# Patient Record
Sex: Male | Born: 1935 | Race: White | Hispanic: No | Marital: Married | Smoking: Former smoker
Health system: Southern US, Community
[De-identification: ages and names within clinical notes are randomized; demographics above are authoritative.]

## PROBLEM LIST (undated history)

## (undated) DIAGNOSIS — N200 Calculus of kidney: Secondary | ICD-10-CM

## (undated) DIAGNOSIS — I209 Angina pectoris, unspecified: Secondary | ICD-10-CM

## (undated) DIAGNOSIS — I10 Essential (primary) hypertension: Secondary | ICD-10-CM

## (undated) DIAGNOSIS — R9439 Abnormal result of other cardiovascular function study: Secondary | ICD-10-CM

## (undated) DIAGNOSIS — M519 Unspecified thoracic, thoracolumbar and lumbosacral intervertebral disc disorder: Secondary | ICD-10-CM

## (undated) DIAGNOSIS — E785 Hyperlipidemia, unspecified: Secondary | ICD-10-CM

## (undated) HISTORY — PX: CATARACT EXTRACTION: SUR2

## (undated) HISTORY — DX: Abnormal result of other cardiovascular function study: R94.39

## (undated) HISTORY — PX: TONSILLECTOMY: SUR1361

## (undated) HISTORY — PX: CARPAL TUNNEL RELEASE: SHX101

## (undated) HISTORY — PX: LUMBAR LAMINECTOMY: SHX95

## (undated) HISTORY — DX: Angina pectoris, unspecified: I20.9

## (undated) HISTORY — PX: KIDNEY STONE SURGERY: SHX686

---

## 2001-03-02 ENCOUNTER — Encounter: Admission: RE | Admit: 2001-03-02 | Discharge: 2001-03-02 | Payer: Self-pay | Admitting: Internal Medicine

## 2001-03-02 ENCOUNTER — Encounter: Payer: Self-pay | Admitting: Internal Medicine

## 2001-06-28 ENCOUNTER — Encounter: Payer: Self-pay | Admitting: Internal Medicine

## 2001-06-28 ENCOUNTER — Encounter: Admission: RE | Admit: 2001-06-28 | Discharge: 2001-06-28 | Payer: Self-pay | Admitting: Internal Medicine

## 2002-06-24 ENCOUNTER — Ambulatory Visit (HOSPITAL_COMMUNITY): Admission: RE | Admit: 2002-06-24 | Discharge: 2002-06-24 | Payer: Self-pay | Admitting: *Deleted

## 2006-03-03 ENCOUNTER — Emergency Department (HOSPITAL_COMMUNITY): Admission: EM | Admit: 2006-03-03 | Discharge: 2006-03-03 | Payer: Self-pay | Admitting: Emergency Medicine

## 2006-03-04 ENCOUNTER — Ambulatory Visit (HOSPITAL_COMMUNITY): Admission: RE | Admit: 2006-03-04 | Discharge: 2006-03-04 | Payer: Self-pay | Admitting: Orthopedic Surgery

## 2011-09-26 ENCOUNTER — Ambulatory Visit
Admission: RE | Admit: 2011-09-26 | Discharge: 2011-09-26 | Disposition: A | Discharge status: Home / Self Care | Payer: Medicare Other | Source: Ambulatory Visit | Attending: Orthopedic Surgery | Admitting: Orthopedic Surgery

## 2011-09-26 ENCOUNTER — Other Ambulatory Visit: Payer: Self-pay | Admitting: Orthopedic Surgery

## 2011-09-26 DIAGNOSIS — M542 Cervicalgia: Secondary | ICD-10-CM

## 2014-03-11 ENCOUNTER — Other Ambulatory Visit: Payer: Self-pay | Admitting: Cardiology

## 2014-03-11 ENCOUNTER — Ambulatory Visit
Admission: RE | Admit: 2014-03-11 | Discharge: 2014-03-11 | Disposition: A | Discharge status: Home / Self Care | Payer: Medicare Other | Source: Ambulatory Visit | Attending: Cardiology | Admitting: Cardiology

## 2014-03-11 DIAGNOSIS — R0789 Other chest pain: Secondary | ICD-10-CM

## 2014-03-11 NOTE — H&P (Signed)
Benjamin Jensen  Date of visit:  03/11/2014 DOB:  Dec 08, 1935    Age:  78 yrs. Medical record number:  [id number]     Account number:  [id number] Primary Care Provider: Lorene Dy ____________________________ CURRENT DIAGNOSES  1. Chest Pain  2. Hyperlipidemia  3. Hypertension,Essential (Benign) ____________________________ ALLERGIES  Pravastatin, Muscle aches ____________________________ MEDICATIONS  1. amlodipine 5 mg tablet, 1 p.o. daily  2. aspirin 81 mg chewable tablet, 1 p.o. daily  3. red yeast rice 600 mg tablet, 1 p.o. daily  4. saw palmetto 500 mg capsule, 1 p.o. daily  5. Ambien 5 mg tablet, PRN  6. glucosamine HCl 1,500 mg tablet, BID  7. Urinozinc Prostate Formula 100 mg tablet, 1 p.o. daily  8. Fish Oil 360 mg-1,200 mg capsule, 2 p.o. daily  9. triamterene 75 mg-hydrochlorothiazide 50 mg tablet, 1/2 tab daily  10. metoprolol tartrate 25 mg tablet, BID ____________________________ CHIEF COMPLAINTS  Chest pressure  Dyspnea ____________________________ HISTORY OF PRESENT ILLNESS This very nice 78 year old male is seen at the request of Dr. Mancel Bale for evaluation of chest discomfort. The patient has a previous diagnosis of hypertension and hyperlipidemia. He developed severe muscle weakness when he tried to take statin therapy and has not been using it. For several months he has had some mild dyspnea on exertion but he has also had intermittent chest discomfort. He describes the feeling as a lower chest pressure type feeling that will last for up to an hour. It is not related to activity or exertion and as a matter fact he had an episode earlier today. It does not radiate to the neck or the arms and is not associated with sweating. The pain is not pleuritic and not made worse with food. He was seen at his primary 67 office recently and I was asked to see him for evaluation. He does not have PND, orthopnea, syncope, claudication or palpitations. He does have a past  history of lumbar disc disease as well as carpal tunnel syndrome.  He had a treadmill test done in the office today that had clinical angina as well as ST depression with T wave inversions in recovery however he had good exercise capacity walking 10 minutes into Bruce stage IV. ____________________________ PAST HISTORY  Past Medical Illnesses:  hypertension, hyperlipidemia, lumbar disc disease, kidney stones, carpal tunnel syndrome;  Cardiovascular Illnesses:  no previous history of cardiac disease.;  Surgical Procedures:  cataract extraction OU, carpal tunnel release, kidney stone removal, laminectomy lumbar, tonsillectomy;  Cardiology Procedures-Invasive:  no history of prior cardiac procedures;  Cardiology Procedures-Noninvasive:  treadmill;  LVEF not documented,   ____________________________ CARDIO-PULMONARY TEST DATES EKG Date:  03/11/2014;   ____________________________ FAMILY HISTORY Father -- Father dead, Heart Attack, Diabetes mellitus Mother -- Mother dead, Congestive heart failure, Coronary artery bypass grafting Sister -- Sister alive with problem, TIA ____________________________ SOCIAL HISTORY Alcohol Use:  no alcohol use;  Smoking:  used to smoke but quit Prior to 1980;  Diet:  regular diet;  Lifestyle:  divorced and remarried;  Exercise:  exercises regularly;  Occupation:  retired Public librarian;  Residence:  lives with wife;   ____________________________ REVIEW OF SYSTEMS General:  denies recent weight change, fatique or change in exercise tolerance.  Integumentary:no rashes or new skin lesions. Eyes: wears eye glasses/contact lenses, cataract extraction bilaterally Ears, Nose, Throat, Mouth:  denies any hearing loss, epistaxis, hoarseness or difficulty speaking. Respiratory: mild dyspnea with exertion Cardiovascular:  please review HPI Abdominal: denies dyspepsia, GI bleeding, constipation, or  diarrhea Genitourinary-Male: hesitancy  Musculoskeletal:  mild chronic back pain  Neurological:  denies headaches, stroke, or TIA Psychiatric:  denies depession or anxiety  ____________________________ PHYSICAL EXAMINATION VITAL SIGNS  Blood Pressure:  144/80 Sitting, Right arm, regular cuff  , 150/80 Standing, Right arm and regular cuff   Pulse:  64/min. Weight:  181.00 lbs. Height:  69"BMI: 27  Constitutional:  pleasant white male in no acute distress Skin:  warm and dry to touch, no apparent skin lesions, or masses noted. Head:  normocephalic, normal hair pattern, no masses or tenderness Eyes:  EOMS Intact, PERRLA, C and S clear, Funduscopic exam not done. ENT:  ears, nose and throat reveal no gross abnormalities.  Dentition good. Neck:  supple, without massess. No JVD, thyromegaly or carotid bruits. Carotid upstroke normal. Chest:  normal symmetry, clear to auscultation. Cardiac:  regular rhythm, normal S1 and S2, No S3 or S4, no murmurs, gallops or rubs detected. Abdomen:  abdomen soft,non-tender, no masses, no hepatospenomegaly, or aneurysm noted Peripheral Pulses:  the femoral,dorsalis pedis, and posterior tibial pulses are full and equal bilaterally with no bruits auscultated. Extremities & Back:  no deformities, clubbing, cyanosis, erythema or edema observed. Normal muscle strength and tone. Neurological:  no gross motor or sensory deficits noted, affect appropriate, oriented x3. ____________________________ MOST RECENT LIPID PANEL 03/06/14  CHOL TOTL 223 mg/dl, LDL 157 NM, HDL 40 mg/dl, TRIGLYCER 131 mg/dl, ALT 18 u/l, ALK PHOS 37 u/l, CHOL/HDL 5.6 (Calc) and AST 17 u/l ____________________________ IMPRESSIONS/PLAN  1. Chest discomfort with some atypical features but with an abnormal exercise treadmill and clinical angina on the treadmill 2. Hypertension 3. Hyperlipidemia with history of statin intolerance 4. Lumbar disc disease 5. History of carpal tunnel syndrome  Recommendations:  In light of abnormal treadmill and chest pain suggestive of angina  plan cardiac catheterization. He was started on metoprolol 25 mg twice daily. Cardiac catheterization was discussed with the patient including risks of myocardial infarction, death, stroke, bleeding, arrhythmia, dye allergy, or renal insufficiency. He understands and is willing to proceed. Possibility of percutaneous intervention at the same setting was also discussed with the patient including risks. ____________________________ TODAYS ORDERS  1. CHEST XRAY: Today  2. Chest X-ray PA/Lat: today  3. 12 Lead EKG: Today                       ____________________________ Cardiology Physician:  Kerry Hough MD [facility]

## 2014-03-12 ENCOUNTER — Encounter (HOSPITAL_COMMUNITY): Payer: Self-pay

## 2014-03-17 ENCOUNTER — Encounter (HOSPITAL_COMMUNITY)
Admission: RE | Disposition: A | Discharge status: Home / Self Care | Payer: Self-pay | Source: Ambulatory Visit | Attending: Cardiology

## 2014-03-17 ENCOUNTER — Encounter (HOSPITAL_COMMUNITY): Payer: Self-pay | Admitting: Cardiology

## 2014-03-17 ENCOUNTER — Ambulatory Visit (HOSPITAL_COMMUNITY)
Admission: RE | Admit: 2014-03-17 | Discharge: 2014-03-17 | Disposition: A | Discharge status: Home / Self Care | Payer: Medicare Other | Source: Ambulatory Visit | Attending: Cardiology | Admitting: Cardiology

## 2014-03-17 DIAGNOSIS — I10 Essential (primary) hypertension: Secondary | ICD-10-CM

## 2014-03-17 DIAGNOSIS — E785 Hyperlipidemia, unspecified: Secondary | ICD-10-CM | POA: Diagnosis not present

## 2014-03-17 DIAGNOSIS — Z79899 Other long term (current) drug therapy: Secondary | ICD-10-CM | POA: Insufficient documentation

## 2014-03-17 DIAGNOSIS — R079 Chest pain, unspecified: Secondary | ICD-10-CM | POA: Insufficient documentation

## 2014-03-17 DIAGNOSIS — R9439 Abnormal result of other cardiovascular function study: Secondary | ICD-10-CM

## 2014-03-17 DIAGNOSIS — Z7982 Long term (current) use of aspirin: Secondary | ICD-10-CM | POA: Diagnosis not present

## 2014-03-17 DIAGNOSIS — R0609 Other forms of dyspnea: Secondary | ICD-10-CM | POA: Diagnosis not present

## 2014-03-17 DIAGNOSIS — R0989 Other specified symptoms and signs involving the circulatory and respiratory systems: Secondary | ICD-10-CM | POA: Insufficient documentation

## 2014-03-17 DIAGNOSIS — I209 Angina pectoris, unspecified: Secondary | ICD-10-CM

## 2014-03-17 HISTORY — DX: Hyperlipidemia, unspecified: E78.5

## 2014-03-17 HISTORY — DX: Unspecified thoracic, thoracolumbar and lumbosacral intervertebral disc disorder: M51.9

## 2014-03-17 HISTORY — DX: Essential (primary) hypertension: I10

## 2014-03-17 HISTORY — PX: LEFT HEART CATHETERIZATION WITH CORONARY ANGIOGRAM: SHX5451

## 2014-03-17 HISTORY — DX: Abnormal result of other cardiovascular function study: R94.39

## 2014-03-17 HISTORY — DX: Calculus of kidney: N20.0

## 2014-03-17 HISTORY — DX: Angina pectoris, unspecified: I20.9

## 2014-03-17 LAB — PROTIME-INR
INR: 0.99 (ref 0.00–1.49)
Prothrombin Time: 13.1 seconds (ref 11.6–15.2)

## 2014-03-17 SURGERY — LEFT HEART CATHETERIZATION WITH CORONARY ANGIOGRAM
Anesthesia: LOCAL

## 2014-03-17 MED ORDER — SODIUM CHLORIDE 0.9 % IJ SOLN
3.0000 mL | INTRAMUSCULAR | Status: DC | PRN
  Administered 2014-03-17: 3 mL via INTRAVENOUS

## 2014-03-17 MED ORDER — SODIUM CHLORIDE 0.9 % IJ SOLN: 3.0000 mL | Freq: Two times a day (BID) | INTRAMUSCULAR | Status: DC

## 2014-03-17 MED ORDER — MIDAZOLAM HCL 2 MG/2ML IJ SOLN
INTRAMUSCULAR | Status: AC
Start: 2014-03-17 — End: 2014-03-17
  Filled 2014-03-17: qty 2

## 2014-03-17 MED ORDER — SODIUM CHLORIDE 0.9 % IV SOLN: 250.0000 mL | INTRAVENOUS | Status: DC | PRN

## 2014-03-17 MED ORDER — ASPIRIN 81 MG PO CHEW
CHEWABLE_TABLET | ORAL | Status: AC
  Filled 2014-03-17: qty 1

## 2014-03-17 MED ORDER — NITROGLYCERIN 0.2 MG/ML ON CALL CATH LAB
INTRAVENOUS | Status: AC
  Filled 2014-03-17: qty 1

## 2014-03-17 MED ORDER — ASPIRIN 81 MG PO CHEW
81.0000 mg | CHEWABLE_TABLET | ORAL | Status: AC
  Administered 2014-03-17: 81 mg via ORAL

## 2014-03-17 MED ORDER — SODIUM CHLORIDE 0.9 % IV SOLN
INTRAVENOUS | Status: DC
  Administered 2014-03-17: 06:00:00 via INTRAVENOUS

## 2014-03-17 MED ORDER — SODIUM CHLORIDE 0.9 % IV SOLN: 1.0000 mL/kg/h | INTRAVENOUS | Status: DC

## 2014-03-17 MED ORDER — LIDOCAINE HCL (PF) 1 % IJ SOLN
INTRAMUSCULAR | Status: AC
  Filled 2014-03-17: qty 30

## 2014-03-17 MED ORDER — HEPARIN (PORCINE) IN NACL 2-0.9 UNIT/ML-% IJ SOLN
INTRAMUSCULAR | Status: AC
Start: 2014-03-17 — End: 2014-03-17
  Filled 2014-03-17: qty 1000

## 2014-03-17 NOTE — Discharge Instructions (Signed)
Heart Catheterization [facility] ° ° ° °A catheter was placed through the blood vessel in your groin, contrast was injected into the vessels, and pictures were taken. ° ° You may feel some discomfort at the insertion site after the local anesthetic wears off. This discomfort should gradually improve over the next several days. °· Only take over-the-counter or prescription medicines for pain, discomfort, or fever as directed by your caregiver.  °· Complications are very uncommon after this procedure.Call my office if you develop any of the following symptoms:  °· Worsening pain.  °· Bleeding.  °· Severe swelling at the puncture site.  °· Lightheadedness.  °· Dizziness or fainting.  °· Fever or chills.  °· If oozing, bleeding, or a lump appears at the puncture site, apply firm pressure directly to the site steadily for 15 minutes and go to the emergency department.  °· Keep the skin around the insertion site dry. You may take showers after 24 hours. If the area does get wet, dry the skin completely. Avoid baths until the skin puncture site heals, usually 5 to 7 days.  °· Rest  for the remainder of the day and avoid any heavy lifting (more than 10 pounds or 4.5 kg). Do not operate heavy machinery, drive, or make legal decisions for the first 24 hours after the procedure. Have a responsible person drive you home.  °· You may resume your usual diet after the procedure. Avoid alcoholic beverages for 24 hours after the procedure.  ° °

## 2014-03-17 NOTE — Progress Notes (Signed)
Received report from Vernona RiegerLaura, RN from cath lab

## 2014-03-17 NOTE — Progress Notes (Addendum)
Order for sheath removal verified per post procedural orders. Procedure explained to patient and Rt femoral artery access site assessed: level 0, right palpable dorsalis pedis and posterior tibial pulses. 5 [place] Sheath removed and manual pressure applied for 20  minutes. Pre, peri, & post procedural vitals: HR 51, RR 12 , O2 Sat 98% RA, BP 130/76, Pain 0. Distal pulses remained intact after sheath removal. Access site level 0 and dressed with 4X4 gauze and tegaderm.  , StatisticianCrystal RN confirmed condition of site. Post procedural instructions discussed with return demonstration from patient.

## 2014-03-17 NOTE — Interval H&P Note (Signed)
History and Physical Interval Note:  03/17/2014 6:46 AM   Patient seen and examined.  No interval change in history and exam since last note.  Stable for procedure.  Benjamin Jensen Benjamin Jensen, Jr. MD [facility]  03/17/2014

## 2014-03-17 NOTE — H&P (View-Only) (Signed)
Benjamin Jensen  Date of visit:  03/11/2014 DOB:  Dec 08, 1935    Age:  78 yrs. Medical record number:  [id number]     Account number:  [id number] Primary Care Provider: Lorene Dy ____________________________ CURRENT DIAGNOSES  1. Chest Pain  2. Hyperlipidemia  3. Hypertension,Essential (Benign) ____________________________ ALLERGIES  Pravastatin, Muscle aches ____________________________ MEDICATIONS  1. amlodipine 5 mg tablet, 1 p.o. daily  2. aspirin 81 mg chewable tablet, 1 p.o. daily  3. red yeast rice 600 mg tablet, 1 p.o. daily  4. saw palmetto 500 mg capsule, 1 p.o. daily  5. Ambien 5 mg tablet, PRN  6. glucosamine HCl 1,500 mg tablet, BID  7. Urinozinc Prostate Formula 100 mg tablet, 1 p.o. daily  8. Fish Oil 360 mg-1,200 mg capsule, 2 p.o. daily  9. triamterene 75 mg-hydrochlorothiazide 50 mg tablet, 1/2 tab daily  10. metoprolol tartrate 25 mg tablet, BID ____________________________ CHIEF COMPLAINTS  Chest pressure  Dyspnea ____________________________ HISTORY OF PRESENT ILLNESS This very nice 78 year old male is seen at the request of Dr. Mancel Bale for evaluation of chest discomfort. The patient has a previous diagnosis of hypertension and hyperlipidemia. He developed severe muscle weakness when he tried to take statin therapy and has not been using it. For several months he has had some mild dyspnea on exertion but he has also had intermittent chest discomfort. He describes the feeling as a lower chest pressure type feeling that will last for up to an hour. It is not related to activity or exertion and as a matter fact he had an episode earlier today. It does not radiate to the neck or the arms and is not associated with sweating. The pain is not pleuritic and not made worse with food. He was seen at his primary 67 office recently and I was asked to see him for evaluation. He does not have PND, orthopnea, syncope, claudication or palpitations. He does have a past  history of lumbar disc disease as well as carpal tunnel syndrome.  He had a treadmill test done in the office today that had clinical angina as well as ST depression with T wave inversions in recovery however he had good exercise capacity walking 10 minutes into Bruce stage IV. ____________________________ PAST HISTORY  Past Medical Illnesses:  hypertension, hyperlipidemia, lumbar disc disease, kidney stones, carpal tunnel syndrome;  Cardiovascular Illnesses:  no previous history of cardiac disease.;  Surgical Procedures:  cataract extraction OU, carpal tunnel release, kidney stone removal, laminectomy lumbar, tonsillectomy;  Cardiology Procedures-Invasive:  no history of prior cardiac procedures;  Cardiology Procedures-Noninvasive:  treadmill;  LVEF not documented,   ____________________________ CARDIO-PULMONARY TEST DATES EKG Date:  03/11/2014;   ____________________________ FAMILY HISTORY Father -- Father dead, Heart Attack, Diabetes mellitus Mother -- Mother dead, Congestive heart failure, Coronary artery bypass grafting Sister -- Sister alive with problem, TIA ____________________________ SOCIAL HISTORY Alcohol Use:  no alcohol use;  Smoking:  used to smoke but quit Prior to 1980;  Diet:  regular diet;  Lifestyle:  divorced and remarried;  Exercise:  exercises regularly;  Occupation:  retired Public librarian;  Residence:  lives with wife;   ____________________________ REVIEW OF SYSTEMS General:  denies recent weight change, fatique or change in exercise tolerance.  Integumentary:no rashes or new skin lesions. Eyes: wears eye glasses/contact lenses, cataract extraction bilaterally Ears, Nose, Throat, Mouth:  denies any hearing loss, epistaxis, hoarseness or difficulty speaking. Respiratory: mild dyspnea with exertion Cardiovascular:  please review HPI Abdominal: denies dyspepsia, GI bleeding, constipation, or  diarrhea Genitourinary-Male: hesitancy  Musculoskeletal:  mild chronic back pain  Neurological:  denies headaches, stroke, or TIA Psychiatric:  denies depession or anxiety  ____________________________ PHYSICAL EXAMINATION VITAL SIGNS  Blood Pressure:  144/80 Sitting, Right arm, regular cuff  , 150/80 Standing, Right arm and regular cuff   Pulse:  64/min. Weight:  181.00 lbs. Height:  69"BMI: 27  Constitutional:  pleasant white male in no acute distress Skin:  warm and dry to touch, no apparent skin lesions, or masses noted. Head:  normocephalic, normal hair pattern, no masses or tenderness Eyes:  EOMS Intact, PERRLA, C and S clear, Funduscopic exam not done. ENT:  ears, nose and throat reveal no gross abnormalities.  Dentition good. Neck:  supple, without massess. No JVD, thyromegaly or carotid bruits. Carotid upstroke normal. Chest:  normal symmetry, clear to auscultation. Cardiac:  regular rhythm, normal S1 and S2, No S3 or S4, no murmurs, gallops or rubs detected. Abdomen:  abdomen soft,non-tender, no masses, no hepatospenomegaly, or aneurysm noted Peripheral Pulses:  the femoral,dorsalis pedis, and posterior tibial pulses are full and equal bilaterally with no bruits auscultated. Extremities & Back:  no deformities, clubbing, cyanosis, erythema or edema observed. Normal muscle strength and tone. Neurological:  no gross motor or sensory deficits noted, affect appropriate, oriented x3. ____________________________ MOST RECENT LIPID PANEL 03/06/14  CHOL TOTL 223 mg/dl, LDL 157 NM, HDL 40 mg/dl, TRIGLYCER 131 mg/dl, ALT 18 u/l, ALK PHOS 37 u/l, CHOL/HDL 5.6 (Calc) and AST 17 u/l ____________________________ IMPRESSIONS/PLAN  1. Chest discomfort with some atypical features but with an abnormal exercise treadmill and clinical angina on the treadmill 2. Hypertension 3. Hyperlipidemia with history of statin intolerance 4. Lumbar disc disease 5. History of carpal tunnel syndrome  Recommendations:  In light of abnormal treadmill and chest pain suggestive of angina  plan cardiac catheterization. He was started on metoprolol 25 mg twice daily. Cardiac catheterization was discussed with the patient including risks of myocardial infarction, death, stroke, bleeding, arrhythmia, dye allergy, or renal insufficiency. He understands and is willing to proceed. Possibility of percutaneous intervention at the same setting was also discussed with the patient including risks. ____________________________ TODAYS ORDERS  1. CHEST XRAY: Today  2. Chest X-ray PA/Lat: today  3. 12 Lead EKG: Today                       ____________________________ Cardiology Physician:  Kerry Hough MD [facility]

## 2014-03-17 NOTE — CV Procedure (Signed)
Cardiac Catheterization Report   Benjamin Jensen    78 y.o.  male  DOB: October 24, 1935  MRN: [id number]  03/17/2014    PROCEDURE:  Left heart catheterization with selective coronary angiography, left ventriculogram.  INDICATIONS:  Abnormal treadmill the patient with multiple risk factors and angina pectoris with an abnormal exercise treadmill  The risks, benefits, and details of the procedure were explained to the patient.  The patient verbalized understanding and wanted to proceed.  Informed written consent was obtained.  PROCEDURE TECHNIQUE:  After Xylocaine anesthesia a 33F sheath was placed in the right femoral artery with a single anterior needle wall stick.   Left coronary angiography was done using a Judkins L4 guide catheter.  Right coronary angiography was done using a Judkins R4 guide catheter.  A 30 cc ventriculogram was performed in the 30 degree RAO projection.  Tolerated the procedure well.  Sheath removed in the holding area.   CONTRAST:  Total of 55 cc.  COMPLICATIONS:  None.    HEMODYNAMICS:  Aortic pressure was 136/66; left ventricle postcontrast 136/5-11.  There was no gradient between the left ventricle and aorta.    ANGIOGRAPHIC DATA:    CORONARY ARTERIES:   Arise and distribute normally.  Right dominant. No coronary calcification is noted.  Left main coronary artery: Normal   Left anterior descending: Scattered irregularities but no significant obstructive stenoses noted.  Circumflex coronary artery: Scattered irregularities with no significant obstructive stenosis is noted.  Right coronary artery: Scattered irregularities.  LEFT VENTRICULOGRAM:  Performed in the 30 RAO projection.  The aortic and mitral valves are normal. The left ventricle is normal in size with normal wall motion. Estimated ejection fraction is 60%.   IMPRESSIONS:  1. Scattered irregularities but no significant obstructive coronary artery disease  noted 2. Normal left ventricle.Marland Kitchen.  RECOMMENDATION:  Continued medical therapy, good blood pressure control.Darden Palmer.   W. Spencer Tilley, Jr. MD [facility]

## 2014-04-10 ENCOUNTER — Ambulatory Visit: Payer: Medicare Other | Admitting: Cardiovascular Disease

## 2014-07-17 ENCOUNTER — Encounter (HOSPITAL_COMMUNITY): Payer: Self-pay | Admitting: Cardiology

## 2014-09-30 ENCOUNTER — Encounter: Payer: Self-pay | Admitting: Cardiology

## 2015-05-06 ENCOUNTER — Other Ambulatory Visit: Payer: Self-pay | Admitting: Internal Medicine

## 2015-05-06 DIAGNOSIS — R109 Unspecified abdominal pain: Secondary | ICD-10-CM

## 2015-05-12 ENCOUNTER — Ambulatory Visit
Admission: RE | Admit: 2015-05-12 | Discharge: 2015-05-12 | Disposition: A | Discharge status: Home / Self Care | Payer: Medicare Other | Source: Ambulatory Visit | Attending: Internal Medicine | Admitting: Internal Medicine

## 2015-05-12 DIAGNOSIS — R109 Unspecified abdominal pain: Secondary | ICD-10-CM

## 2018-10-20 NOTE — Progress Notes (Signed)
Cardiology Office Note   Date:  10/25/2018   ID:  Benjamin Jensen, DOB [birth year], MRN [id number]  PCP:  Burton Apley, MD  Cardiologist:   Aldeen Riga [place], MD   Chief Complaint  Patient presents with  . New Patient (Initial Visit)    re-establish, pt c/o dizziness sitting to standing      History of Present Illness: Benjamin Jensen is a 83 y.o. male who is seen at the request of Dr. Su Hilt for evaluation of CAD. He is a former patient of Dr. Donnie Aho. He had a prior abnormal stress test in 2015. This led to a cardiac cath showing only minimal wall irregularities and normal LV function. He has a history of HLD and HTN.   On follow up today he denies any chest pain. Feels a little SOB. Main complaint is that he is lightheaded if he squats and then stands suddenly. No palpitations. No known history of murmur.     Past Medical History:  Diagnosis Date  . Abnormal stress ECG with treadmill 03/17/2014  . Angina pectoris (HCC) 03/17/2014  . Essential hypertension 03/17/2014  . Hyperlipidemia 03/17/2014  . Kidney stones   . Lumbar disc disease     Past Surgical History:  Procedure Laterality Date  . CARPAL TUNNEL RELEASE    . CATARACT EXTRACTION    . KIDNEY STONE SURGERY    . LEFT HEART CATHETERIZATION WITH CORONARY ANGIOGRAM N/A 03/17/2014   Procedure: LEFT HEART CATHETERIZATION WITH CORONARY ANGIOGRAM;  Surgeon: Othella Boyer, MD;  Location: [facility] CATH LAB;  Service: Cardiovascular;  Laterality: N/A;  . LUMBAR LAMINECTOMY    . TONSILLECTOMY       Current Outpatient Medications  Medication Sig Dispense Refill  . alfuzosin (UROXATRAL) 10 MG 24 hr tablet Take 1 tablet by mouth daily.    Marland Kitchen amLODipine (NORVASC) 5 MG tablet Take 5 mg by mouth daily.    Marland Kitchen aspirin EC 81 MG tablet Take 81 mg by mouth daily.    Marland Kitchen atorvastatin (LIPITOR) 10 MG tablet Take 1 tablet by mouth daily.    . Glucosamine HCl (GLUCOSAMINE PO) Take 2 capsules by mouth daily.     Marland Kitchen OVER THE COUNTER MEDICATION Take  1 tablet by mouth daily. Uro-Zinc prostate health    . Saw Palmetto, Serenoa repens, (SAW PALMETTO PO) Take 1 capsule by mouth daily.    Marland Kitchen triamterene-hydrochlorothiazide (MAXZIDE-25) 37.5-25 MG tablet Take 1 tablet by mouth daily.    Marland Kitchen zolpidem (AMBIEN) 5 MG tablet Take 5 mg by mouth at bedtime as needed for sleep.     No current facility-administered medications for this visit.     Allergies:   Patient has no known allergies.    Social History:  The patient  reports that he has quit smoking. He has never used smokeless tobacco. He reports that he does not drink alcohol or use drugs.   Family History:  The patient's family history includes Heart attack in his father; Heart failure in his mother.    ROS:  Please see the history of present illness.   Otherwise, review of systems are positive for none.   All other systems are reviewed and negative.    PHYSICAL EXAM: VS:  BP 132/75   Pulse 61   Ht  (1.753 m)   Wt 185 lb 6.4 oz (84.1 kg)   BMI 27.38 kg/m  , BMI Body mass index is 27.38 kg/m.  Orthostatic BP supine 129/73, sitting 126/72, standing 120/70.  Pulse increased from 59 to 71.  GEN: Well nourished, well developed, in no acute distress  HEENT: normal  Neck: no JVD, carotid bruits, or masses Cardiac: RRR; he has a grade 2/6 systolic late peaking murmur at the apex.  Respiratory:  clear to auscultation bilaterally, normal work of breathing GI: soft, nontender, nondistended, + BS MS: no deformity or atrophy  Skin: warm and dry, no rash Neuro:  Strength and sensation are intact Psych: euthymic mood, full affect   EKG:  EKG is ordered today. The ekg ordered today demonstrates NSR rate 61. Normal Ecg. I have personally reviewed and interpreted this study.    Recent Labs: No results found for requested labs within last 8760 hours.    Lipid Panel No results found for: CHOL, TRIG, HDL, CHOLHDL, VLDL, LDLCALC, LDLDIRECT    Wt Readings from Last 3 Encounters:   10/25/18 185 lb 6.4 oz (84.1 kg)  03/17/14 180 lb (81.6 kg)      Other studies Reviewed: Additional studies/ records that were reviewed today include:   Cardiac Catheterization Report   Benjamin Jensen    83 y.o.  male  DOB: 22-Oct-1935  MRN: [id number]  03/17/2014    PROCEDURE:  Left heart catheterization with selective coronary angiography, left ventriculogram.  INDICATIONS:  Abnormal treadmill the patient with multiple risk factors and angina pectoris with an abnormal exercise treadmill  The risks, benefits, and details of the procedure were explained to the patient.  The patient verbalized understanding and wanted to proceed.  Informed written consent was obtained.  PROCEDURE TECHNIQUE:  After Xylocaine anesthesia a 76F sheath was placed in the right femoral artery with a single anterior needle wall stick.   Left coronary angiography was done using a Judkins L4 guide catheter.  Right coronary angiography was done using a Judkins R4 guide catheter.  A 30 cc ventriculogram was performed in the 30 degree RAO projection.  Tolerated the procedure well.  Sheath removed in the holding area.   CONTRAST:  Total of 55 cc.  COMPLICATIONS:  None.    HEMODYNAMICS:  Aortic pressure was 136/66; left ventricle postcontrast 136/5-11.  There was no gradient between the left ventricle and aorta.    ANGIOGRAPHIC DATA:    CORONARY ARTERIES:   Arise and distribute normally.  Right dominant. No coronary calcification is noted.  Left main coronary artery: Normal   Left anterior descending: Scattered irregularities but no significant obstructive stenoses noted.  Circumflex coronary artery: Scattered irregularities with no significant obstructive stenosis is noted.  Right coronary artery: Scattered irregularities.  LEFT VENTRICULOGRAM:  Performed in the 30 RAO projection.  The aortic and mitral valves are normal. The left ventricle is normal in size with normal wall motion. Estimated  ejection fraction is 60%.   IMPRESSIONS:  1. Scattered irregularities but no significant obstructive coronary artery disease noted 2. Normal left ventricle.Marland Kitchen  RECOMMENDATION:  Continued medical therapy, good blood pressure control.Darden Palmer MD [facility]   ASSESSMENT AND PLAN:  1.  CAD. Nonobstructive by cath in 2015. No active angina. Continue ASA, statin therapy 2. HLD. On lipitor. Will request latest lab work from Dr Su Hilt to review.  3. HTN. Well controlled. No significant orthostasis. I suspect his lightheadedness is mainly due to the fact that his vascular reflexes are not as brisk. 4. Murmur. Not noted before. Suspect he has MR and possibly MV prolapse. Will update Echo.   Current medicines are reviewed at length with the patient today.  The patient does not have concerns regarding medicines.  The following changes have been made:  no change  Labs/ tests ordered today include:   Orders Placed This Encounter  Procedures  . EKG 12-Lead  . ECHOCARDIOGRAM COMPLETE     Disposition:   FU with me  in 1 year  Signed, Georgi Tuel [place], MD  10/25/2018 9:19 AM    [facility] [address] [place], [place], [address] Phone [phone], Fax [phone]

## 2018-10-25 ENCOUNTER — Ambulatory Visit: Payer: Medicare Other | Admitting: Cardiology

## 2018-10-25 ENCOUNTER — Encounter: Payer: Self-pay | Admitting: Cardiology

## 2018-10-25 VITALS — BP 132/75 | HR 61 | Ht 69.0 in | Wt 185.4 lb

## 2018-10-25 DIAGNOSIS — R011 Cardiac murmur, unspecified: Secondary | ICD-10-CM

## 2018-10-25 DIAGNOSIS — E7800 Pure hypercholesterolemia, unspecified: Secondary | ICD-10-CM | POA: Diagnosis not present

## 2018-10-25 DIAGNOSIS — I2510 Atherosclerotic heart disease of native coronary artery without angina pectoris: Secondary | ICD-10-CM

## 2018-10-25 DIAGNOSIS — I10 Essential (primary) hypertension: Secondary | ICD-10-CM | POA: Diagnosis not present

## 2018-10-25 NOTE — Patient Instructions (Signed)
Medication Instructions:  Continue same medications If you need a refill on your cardiac medications before your next appointment, please call your pharmacy.   Lab work: None ordered   Testing/Procedures: Schedule echo  Follow-Up: At [facility], you and your health needs are our priority.  As part of our continuing mission to provide you with exceptional heart care, we have created designated Provider Care Teams.  These Care Teams include your primary Cardiologist (physician) and Advanced Practice Providers (APPs -  Physician Assistants and Nurse Practitioners) who all work together to provide you with the care you need, when you need it. . Follow up with Dr.Jordan in 12 months   Call 3 months before to schedule

## 2019-01-24 ENCOUNTER — Telehealth (HOSPITAL_COMMUNITY): Payer: Self-pay

## 2019-01-24 NOTE — Telephone Encounter (Signed)
LMTCB COVID prescreening for echo. Did not leave any echo appointment details (no DPR on file).

## 2019-01-24 NOTE — Telephone Encounter (Signed)

## 2019-01-25 ENCOUNTER — Encounter (INDEPENDENT_AMBULATORY_CARE_PROVIDER_SITE_OTHER): Payer: Self-pay

## 2019-01-25 ENCOUNTER — Ambulatory Visit (HOSPITAL_COMMUNITY): Payer: Medicare Other | Attending: Cardiology

## 2019-01-25 ENCOUNTER — Other Ambulatory Visit: Payer: Self-pay

## 2019-01-25 DIAGNOSIS — R011 Cardiac murmur, unspecified: Secondary | ICD-10-CM | POA: Diagnosis present

## 2019-11-14 ENCOUNTER — Telehealth: Payer: Self-pay | Admitting: *Deleted

## 2019-11-14 NOTE — Telephone Encounter (Signed)
A message was left, re: his follow up visit. 

## 2019-11-17 NOTE — Progress Notes (Signed)
Cardiology Office Note   Date:  11/19/2019   ID:  Benjamin Jensen, DOB 06/11/1936, MRN [id number]  PCP:  Burton Apley, MD  Cardiologist:   Felton Buczynski [place], MD   Chief Complaint  Patient presents with  . Coronary Artery Disease      History of Present Illness: Benjamin Jensen is a 84 y.o. male who is seen for follow up CAD. He is a former patient of Dr. Donnie Aho. He had a prior abnormal stress test in 2015. This led to a cardiac cath showing only minimal wall irregularities and normal LV function. He has a history of HLD and HTN. He also has MVP with mild to moderate MR.   On follow up today he feels well. Has rare symptoms of chest pain but doesn't really bother him. Gets SOB if he has to walk up a long hill. Walks his dog regularly. Overall feels he is doing well.   Past Medical History:  Diagnosis Date  . Abnormal stress ECG with treadmill 03/17/2014  . Angina pectoris (HCC) 03/17/2014  . Essential hypertension 03/17/2014  . Hyperlipidemia 03/17/2014  . Kidney stones   . Lumbar disc disease     Past Surgical History:  Procedure Laterality Date  . CARPAL TUNNEL RELEASE    . CATARACT EXTRACTION    . KIDNEY STONE SURGERY    . LEFT HEART CATHETERIZATION WITH CORONARY ANGIOGRAM N/A 03/17/2014   Procedure: LEFT HEART CATHETERIZATION WITH CORONARY ANGIOGRAM;  Surgeon: Othella Boyer, MD;  Location: [facility] CATH LAB;  Service: Cardiovascular;  Laterality: N/A;  . LUMBAR LAMINECTOMY    . TONSILLECTOMY       Current Outpatient Medications  Medication Sig Dispense Refill  . alfuzosin (UROXATRAL) 10 MG 24 hr tablet Take 1 tablet by mouth daily.    Marland Kitchen amLODipine (NORVASC) 5 MG tablet Take 5 mg by mouth daily.    Marland Kitchen aspirin EC 81 MG tablet Take 81 mg by mouth daily.    Marland Kitchen atorvastatin (LIPITOR) 10 MG tablet Take 1 tablet by mouth daily.    . Glucosamine HCl (GLUCOSAMINE PO) Take 2 capsules by mouth daily.     Marland Kitchen OVER THE COUNTER MEDICATION Take 1 tablet by mouth daily. Uro-Zinc prostate  health    . Saw Palmetto, Serenoa repens, (SAW PALMETTO PO) Take 1 capsule by mouth daily.    Marland Kitchen triamterene-hydrochlorothiazide (MAXZIDE-25) 37.5-25 MG tablet Take 1 tablet by mouth daily.     No current facility-administered medications for this visit.    Allergies:   Patient has no known allergies.    Social History:  The patient  reports that he has quit smoking. He has never used smokeless tobacco. He reports that he does not drink alcohol or use drugs.   Family History:  The patient's family history includes Heart attack in his father; Heart failure in his mother.    ROS:  Please see the history of present illness.   Otherwise, review of systems are positive for none.   All other systems are reviewed and negative.    PHYSICAL EXAM: VS:  BP 135/80   Pulse (!) 55   Temp (!) 97 F (36.1 C)   Ht 5\' 10"  (1.778 m)   Wt 182 lb 12.8 oz (82.9 kg)   SpO2 95%   BMI 26.23 kg/m  , BMI Body mass index is 26.23 kg/m.  GEN: Well nourished, well developed, in no acute distress  HEENT: normal  Neck: no JVD, carotid bruits, or masses Cardiac:  RRR; he has a grade 4-0/0 systolic late peaking murmur at the apex.  Respiratory:  clear to auscultation bilaterally, normal work of breathing GI: soft, nontender, nondistended, + BS MS: no deformity or atrophy  Skin: warm and dry, no rash Neuro:  Strength and sensation are intact Psych: euthymic mood, full affect   EKG:  EKG is ordered today. The ekg ordered today demonstrates NSR rate 55.  Normal Ecg. I have personally reviewed and interpreted this study.    Recent Labs: No results found for requested labs within last 8760 hours.    Lipid Panel No results found for: CHOL, TRIG, HDL, CHOLHDL, VLDL, LDLCALC, LDLDIRECT    Dated 2/27//20: creatinine 1.2.   Wt Readings from Last 3 Encounters:  11/19/19 182 lb 12.8 oz (82.9 kg)  10/25/18 185 lb 6.4 oz (84.1 kg)  03/17/14 180 lb (81.6 kg)      Other studies Reviewed: Additional  studies/ records that were reviewed today include:   Cardiac Catheterization Report   Benjamin Jensen    84 y.o.  male  DOB: Sep 27, 1935  MRN: [id number]  03/17/2014    PROCEDURE:  Left heart catheterization with selective coronary angiography, left ventriculogram.  INDICATIONS:  Abnormal treadmill the patient with multiple risk factors and angina pectoris with an abnormal exercise treadmill  The risks, benefits, and details of the procedure were explained to the patient.  The patient verbalized understanding and wanted to proceed.  Informed written consent was obtained.  PROCEDURE TECHNIQUE:  After Xylocaine anesthesia a 61F sheath was placed in the right femoral artery with a single anterior needle wall stick.   Left coronary angiography was done using a Judkins L4 guide catheter.  Right coronary angiography was done using a Judkins R4 guide catheter.  A 30 cc ventriculogram was performed in the 30 degree RAO projection.  Tolerated the procedure well.  Sheath removed in the holding area.   CONTRAST:  Total of 55 cc.  COMPLICATIONS:  None.    HEMODYNAMICS:  Aortic pressure was 136/66; left ventricle postcontrast 136/5-11.  There was no gradient between the left ventricle and aorta.    ANGIOGRAPHIC DATA:    CORONARY ARTERIES:   Arise and distribute normally.  Right dominant. No coronary calcification is noted.  Left main coronary artery: Normal   Left anterior descending: Scattered irregularities but no significant obstructive stenoses noted.  Circumflex coronary artery: Scattered irregularities with no significant obstructive stenosis is noted.  Right coronary artery: Scattered irregularities.  LEFT VENTRICULOGRAM:  Performed in the 30 RAO projection.  The aortic and mitral valves are normal. The left ventricle is normal in size with normal wall motion. Estimated ejection fraction is 60%.   IMPRESSIONS:  1. Scattered irregularities but no significant obstructive  coronary artery disease noted 2. Normal left ventricle.Marland Kitchen  RECOMMENDATION:  Continued medical therapy, good blood pressure control.Kerry Hough. MD [facility]  Echo 01/25/19: IMPRESSIONS    1. The left ventricle has normal systolic function, with an ejection  fraction of 55-60%. The cavity size was normal. There is mildly increased  left ventricular wall thickness. Left ventricular diastolic Doppler  parameters are consistent with impaired  relaxation.  2. The right ventricle has normal systolic function. The cavity was  normal.  3. Left atrial size was mildly dilated.  4. Moderate mitral valve prolapse.  5. The mitral valve is abnormal. Mild thickening of the mitral valve  leaflet. There is mild mitral annular calcification present. Mitral valve  regurgitation is  mild to moderate by color flow Doppler.  6. The tricuspid valve is grossly normal.  7. The aortic valve is tricuspid. Mild calcification of the aortic valve.  Aortic valve regurgitation is trivial by color flow Doppler. No stenosis  of the aortic valve.  8. There is mild dilatation of the ascending aorta measuring 42 mm.  9. Normal LV systolic function; mild diastolic dysfunction; mild LVH;  trace AI; prolapse of posterior MV leaflet with eccentric MR (appears mild  to moderate but may be underestimated due to eccentric nature of jet);  mild LAE.    ASSESSMENT AND PLAN:  1.  CAD. Nonobstructive by cath in 2015. Rare chest pain.  Continue ASA, statin therapy 2. HLD. On lipitor. Labs followed by Dr Su Hilt.  3. HTN. Well controlled. On amlodpine and Maxide.  4. MVP with mild to moderate MR   Current medicines are reviewed at length with the patient today.  The patient does not have concerns regarding medicines.  The following changes have been made:  no change  Labs/ tests ordered today include:   No orders of the defined types were placed in this encounter.    Disposition:   FU with me  in  1 year  Signed, Sara Keys [place], MD  11/19/2019 1:47 PM    [facility] [address] [place], [place], [address] Phone [phone], Fax [phone]

## 2019-11-19 ENCOUNTER — Encounter: Payer: Self-pay | Admitting: Cardiology

## 2019-11-19 ENCOUNTER — Other Ambulatory Visit: Payer: Self-pay

## 2019-11-19 ENCOUNTER — Ambulatory Visit: Payer: Medicare Other | Admitting: Cardiology

## 2019-11-19 ENCOUNTER — Encounter (INDEPENDENT_AMBULATORY_CARE_PROVIDER_SITE_OTHER): Payer: Self-pay

## 2019-11-19 VITALS — BP 135/80 | HR 55 | Temp 97.0°F | Ht 70.0 in | Wt 182.8 lb

## 2019-11-19 DIAGNOSIS — I2510 Atherosclerotic heart disease of native coronary artery without angina pectoris: Secondary | ICD-10-CM | POA: Diagnosis not present

## 2019-11-19 DIAGNOSIS — E7800 Pure hypercholesterolemia, unspecified: Secondary | ICD-10-CM | POA: Diagnosis not present

## 2019-11-19 DIAGNOSIS — I341 Nonrheumatic mitral (valve) prolapse: Secondary | ICD-10-CM

## 2019-11-19 DIAGNOSIS — I10 Essential (primary) hypertension: Secondary | ICD-10-CM | POA: Diagnosis not present

## 2020-06-06 ENCOUNTER — Ambulatory Visit: Payer: Medicare Other | Attending: Internal Medicine

## 2020-06-06 DIAGNOSIS — Z23 Encounter for immunization: Secondary | ICD-10-CM

## 2020-06-06 NOTE — Progress Notes (Signed)
   Covid-19 Vaccination Clinic  Name:  Benjamin Jensen    MRN: [id number] DOB: [birth year]  06/06/2020  Benjamin Jensen was observed post Covid-19 immunization for 15 minutes without incident. He was provided with Vaccine Information Sheet and instruction to access the V-Safe system.   Benjamin Jensen was instructed to call 911 with any severe reactions post vaccine: Marland Kitchen Difficulty breathing  . Swelling of face and throat  . A fast heartbeat  . A bad rash all over body  . Dizziness and weakness

## 2020-12-05 NOTE — Progress Notes (Signed)
Cardiology Office Note   Date:  12/07/2020   ID:  Benjamin Jensen, DOB 29-Oct-1935, MRN [id number]  PCP:  Burton Apley, MD  Cardiologist:   Kaesha Kirsch [place], MD   Chief Complaint  Patient presents with  . Chest Pain  . Coronary Artery Disease      History of Present Illness: Benjamin Jensen is a 85 y.o. male who is seen for follow up CAD. He is a former patient of Dr. Donnie Aho. He had a prior abnormal stress test in 2015. This led to a cardiac cath showing only minimal wall irregularities and normal LV function. He has a history of HLD and HTN. He also has MVP with mild to moderate MR.   On follow up today he notes that over the past year his energy level has decreased by 50%. He notes he has more chest pressure especially after strenuous exertion or after a busy day. Improved with rest.  He does walk his dog twice a day.    Past Medical History:  Diagnosis Date  . Abnormal stress ECG with treadmill 03/17/2014  . Angina pectoris (HCC) 03/17/2014  . Essential hypertension 03/17/2014  . Hyperlipidemia 03/17/2014  . Kidney stones   . Lumbar disc disease     Past Surgical History:  Procedure Laterality Date  . CARPAL TUNNEL RELEASE    . CATARACT EXTRACTION    . KIDNEY STONE SURGERY    . LEFT HEART CATHETERIZATION WITH CORONARY ANGIOGRAM N/A 03/17/2014   Procedure: LEFT HEART CATHETERIZATION WITH CORONARY ANGIOGRAM;  Surgeon: Othella Boyer, MD;  Location: [facility] CATH LAB;  Service: Cardiovascular;  Laterality: N/A;  . LUMBAR LAMINECTOMY    . TONSILLECTOMY       Current Outpatient Medications  Medication Sig Dispense Refill  . alfuzosin (UROXATRAL) 10 MG 24 hr tablet Take 1 tablet by mouth daily.    Marland Kitchen amLODipine (NORVASC) 5 MG tablet Take 5 mg by mouth daily.    Marland Kitchen aspirin EC 81 MG tablet Take 81 mg by mouth daily.    Marland Kitchen atorvastatin (LIPITOR) 10 MG tablet Take 1 tablet by mouth daily.    . Glucosamine HCl (GLUCOSAMINE PO) Take 2 capsules by mouth daily.     Marland Kitchen OVER THE COUNTER  MEDICATION Take 1 tablet by mouth daily. Uro-Zinc prostate health    . Saw Palmetto, Serenoa repens, (SAW PALMETTO PO) Take 1 capsule by mouth daily.    Marland Kitchen triamterene-hydrochlorothiazide (MAXZIDE-25) 37.5-25 MG tablet Take 1 tablet by mouth daily.     No current facility-administered medications for this visit.    Allergies:   Patient has no known allergies.    Social History:  The patient  reports that he has quit smoking. He has never used smokeless tobacco. He reports that he does not drink alcohol and does not use drugs.   Family History:  The patient's family history includes Heart attack in his father; Heart failure in his mother.    ROS:  Please see the history of present illness.   Otherwise, review of systems are positive for none.   All other systems are reviewed and negative.    PHYSICAL EXAM: VS:  BP (!) 146/72 (BP Location: Left Arm, Patient Position: Sitting, Cuff Size: Normal)   Pulse 62   Ht 5\' 9"  (1.753 m)   Wt 186 lb 3.2 oz (84.5 kg)   BMI 27.50 kg/m  , BMI Body mass index is 27.5 kg/m.  GEN: Well nourished, well developed, in no acute distress  HEENT: normal  Neck: no JVD, carotid bruits, or masses Cardiac: RRR; he has a grade 2/6 systolic late peaking murmur at the apex.  Respiratory:  clear to auscultation bilaterally, normal work of breathing GI: soft, nontender, nondistended, + BS MS: no deformity or atrophy  Skin: warm and dry, no rash Neuro:  Strength and sensation are intact Psych: euthymic mood, full affect   EKG:  EKG is ordered today. The ekg ordered today demonstrates NSR rate 62.  LVH with nonspecific TWA.  I have personally reviewed and interpreted this study.    Recent Labs: No results found for requested labs within last 8760 hours.    Lipid Panel No results found for: CHOL, TRIG, HDL, CHOLHDL, VLDL, LDLCALC, LDLDIRECT    Dated 2/27//20: creatinine 1.2.   Wt Readings from Last 3 Encounters:  12/07/20 186 lb 3.2 oz (84.5 kg)   11/19/19 182 lb 12.8 oz (82.9 kg)  10/25/18 185 lb 6.4 oz (84.1 kg)      Other studies Reviewed: Additional studies/ records that were reviewed today include:   Cardiac Catheterization Report   Benjamin Jensen    85 y.o.  male  DOB: [birth year]  MRN: [id number]  03/17/2014    PROCEDURE:  Left heart catheterization with selective coronary angiography, left ventriculogram.  INDICATIONS:  Abnormal treadmill the patient with multiple risk factors and angina pectoris with an abnormal exercise treadmill  The risks, benefits, and details of the procedure were explained to the patient.  The patient verbalized understanding and wanted to proceed.  Informed written consent was obtained.  PROCEDURE TECHNIQUE:  After Xylocaine anesthesia a 26F sheath was placed in the right femoral artery with a single anterior needle wall stick.   Left coronary angiography was done using a Judkins L4 guide catheter.  Right coronary angiography was done using a Judkins R4 guide catheter.  A 30 cc ventriculogram was performed in the 30 degree RAO projection.  Tolerated the procedure well.  Sheath removed in the holding area.   CONTRAST:  Total of 55 cc.  COMPLICATIONS:  None.    HEMODYNAMICS:  Aortic pressure was 136/66; left ventricle postcontrast 136/5-11.  There was no gradient between the left ventricle and aorta.    ANGIOGRAPHIC DATA:    CORONARY ARTERIES:   Arise and distribute normally.  Right dominant. No coronary calcification is noted.  Left main coronary artery: Normal   Left anterior descending: Scattered irregularities but no significant obstructive stenoses noted.  Circumflex coronary artery: Scattered irregularities with no significant obstructive stenosis is noted.  Right coronary artery: Scattered irregularities.  LEFT VENTRICULOGRAM:  Performed in the 30 RAO projection.  The aortic and mitral valves are normal. The left ventricle is normal in size with normal wall motion.  Estimated ejection fraction is 60%.   IMPRESSIONS:  1. Scattered irregularities but no significant obstructive coronary artery disease noted 2. Normal left ventricle.Marland Kitchen  RECOMMENDATION:  Continued medical therapy, good blood pressure control.Darden Palmer. MD [facility]  Echo 01/25/19: IMPRESSIONS    1. The left ventricle has normal systolic function, with an ejection  fraction of 55-60%. The cavity size was normal. There is mildly increased  left ventricular wall thickness. Left ventricular diastolic Doppler  parameters are consistent with impaired  relaxation.  2. The right ventricle has normal systolic function. The cavity was  normal.  3. Left atrial size was mildly dilated.  4. Moderate mitral valve prolapse.  5. The mitral valve is abnormal. Mild thickening of the  mitral valve  leaflet. There is mild mitral annular calcification present. Mitral valve  regurgitation is mild to moderate by color flow Doppler.  6. The tricuspid valve is grossly normal.  7. The aortic valve is tricuspid. Mild calcification of the aortic valve.  Aortic valve regurgitation is trivial by color flow Doppler. No stenosis  of the aortic valve.  8. There is mild dilatation of the ascending aorta measuring 42 mm.  9. Normal LV systolic function; mild diastolic dysfunction; mild LVH;  trace AI; prolapse of posterior MV leaflet with eccentric MR (appears mild  to moderate but may be underestimated due to eccentric nature of jet);  mild LAE.    ASSESSMENT AND PLAN:  1.  CAD. Nonobstructive by cath in 2015. He is having symptoms of chest pressure concerning for angina. Prior stress testing was falsely positive. Recommend Coronary CTA to evaluate.   Continue ASA, statin therapy 2. HLD. On lipitor. Labs followed by Dr Su Hilt. We will request a copy. 3. HTN. Well controlled. On amlodpine and Maxide.  4. MVP with mild to moderate MR by Echo in 2020. Will update now with above symptoms     Current medicines are reviewed at length with the patient today.  The patient does not have concerns regarding medicines.   Disposition:   FU with me  TBD  Signed, Zamora Colton [place], MD  12/07/2020 4:12 PM    [facility] [address] [place], [place], [address] Phone [phone], Fax [phone]

## 2020-12-07 ENCOUNTER — Encounter: Payer: Self-pay | Admitting: Cardiology

## 2020-12-07 ENCOUNTER — Other Ambulatory Visit: Payer: Self-pay

## 2020-12-07 ENCOUNTER — Ambulatory Visit: Payer: Medicare Other | Admitting: Cardiology

## 2020-12-07 VITALS — BP 146/72 | HR 62 | Ht 69.0 in | Wt 186.2 lb

## 2020-12-07 DIAGNOSIS — I340 Nonrheumatic mitral (valve) insufficiency: Secondary | ICD-10-CM | POA: Diagnosis not present

## 2020-12-07 DIAGNOSIS — R072 Precordial pain: Secondary | ICD-10-CM

## 2020-12-07 DIAGNOSIS — I10 Essential (primary) hypertension: Secondary | ICD-10-CM

## 2020-12-07 DIAGNOSIS — E7800 Pure hypercholesterolemia, unspecified: Secondary | ICD-10-CM

## 2020-12-07 DIAGNOSIS — I341 Nonrheumatic mitral (valve) prolapse: Secondary | ICD-10-CM | POA: Diagnosis not present

## 2020-12-07 MED ORDER — METOPROLOL TARTRATE 50 MG PO TABS: ORAL_TABLET | ORAL | 0 refills | Status: DC

## 2020-12-07 NOTE — Patient Instructions (Addendum)
Medication Instructions:  Continue same medications *If you need a refill on your cardiac medications before your next appointment, please call your pharmacy*   Lab Work: Bmet to be done 1 week before Coronary CT   Testing/Procedures: Echo   Follow-Up: At [facility], you and your health needs are our priority.  As part of our continuing mission to provide you with exceptional heart care, we have created designated Provider Care Teams.  These Care Teams include your primary Cardiologist (physician) and Advanced Practice Providers (APPs -  Physician Assistants and Nurse Practitioners) who all work together to provide you with the care you need, when you need it.  We recommend signing up for the patient portal called "MyChart".  Sign up information is provided on this After Visit Summary.  MyChart is used to connect with patients for Virtual Visits (Telemedicine).  Patients are able to view lab/test results, encounter notes, upcoming appointments, etc.  Non-urgent messages can be sent to your provider as well.   To learn more about what you can do with MyChart, go to [email or web address].    Your next appointment:  Will be determined after test   The format for your next appointment:  Office    Provider:  Dr.Jordan    Your cardiac CT will be scheduled at one of the below locations:   [facility] [address] [place], [place] [address] [phone]  OR  [facility] [address] [place], [place] [address] [phone]  If scheduled at [facility], please arrive at the [facility] main entrance (entrance A) of [facility] 30 minutes prior to test start time. Proceed to the [facility] Radiology Department (first floor) to check-in and test prep.  If scheduled at [facility], please arrive 15 mins early for check-in and test prep.  Please follow these instructions  carefully (unless otherwise directed):  Hold all erectile dysfunction medications at least 3 days (72 hrs) prior to test.  On the Night Before the Test: . Be sure to Drink plenty of water. . Do not consume any caffeinated/decaffeinated beverages or chocolate 12 hours prior to your test. . Do not take any antihistamines 12 hours prior to your test.   On the Day of the Test: . Drink plenty of water until 1 hour prior to the test. . Do not eat any food 4 hours prior to the test. . You may take your regular medications prior to the test.  . Take metoprolol 50 mg two hours prior to test. . HOLD Hydrochlorothiazide morning of the test.          After the Test: . Drink plenty of water. . After receiving IV contrast, you may experience a mild flushed feeling. This is normal. . On occasion, you may experience a mild rash up to 24 hours after the test. This is not dangerous. If this occurs, you can take Benadryl 25 mg and increase your fluid intake. . If you experience trouble breathing, this can be serious. If it is severe call 911 IMMEDIATELY. If it is mild, please call our office.    Once we have confirmed authorization from your insurance company, we will call you to set up a date and time for your test. Based on how quickly your insurance processes prior authorizations requests, please allow up to 4 weeks to be contacted for scheduling your Cardiac CT appointment. Be advised that routine Cardiac CT appointments could be scheduled  as many as 8 weeks after your provider has ordered it.  For non-scheduling related questions, please contact the cardiac imaging nurse navigator should you have any questions/concerns: Marchia Bond, Cardiac Imaging Nurse Navigator Gordy Clement, Cardiac Imaging Nurse Navigator [facility] Direct Office Dial: [phone]   For scheduling needs, including cancellations and rescheduling, please call [place], [phone].

## 2020-12-10 LAB — BASIC METABOLIC PANEL
BUN/Creatinine Ratio: 19 (ref 10–24)
BUN: 23 mg/dL (ref 8–27)
CO2: 22 mmol/L (ref 20–29)
Calcium: 9 mg/dL (ref 8.6–10.2)
Chloride: 105 mmol/L (ref 96–106)
Creatinine, Ser: 1.23 mg/dL (ref 0.76–1.27)
Glucose: 134 mg/dL — ABNORMAL HIGH (ref 65–99)
Potassium: 3.9 mmol/L (ref 3.5–5.2)
Sodium: 142 mmol/L (ref 134–144)
eGFR: 59 mL/min/{1.73_m2} — ABNORMAL LOW (ref >59)

## 2020-12-15 ENCOUNTER — Telehealth (HOSPITAL_COMMUNITY): Payer: Self-pay | Admitting: Emergency Medicine

## 2020-12-15 NOTE — Telephone Encounter (Signed)
Reaching out to patient to offer assistance regarding upcoming cardiac imaging study; pt verbalizes understanding of appt date/time, parking situation and where to check in, pre-test NPO status and medications ordered, and verified current allergies; name and call back number provided for further questions should they arise Rockwell Alexandria RN Navigator Cardiac Imaging [facility] [phone] office [phone] cell  50mg  metoprolol tartrate 2 hr prior to scan Hold triamterene-HCTZ 

## 2020-12-16 ENCOUNTER — Ambulatory Visit (HOSPITAL_COMMUNITY): Payer: Medicare Other

## 2020-12-17 ENCOUNTER — Encounter (HOSPITAL_COMMUNITY): Payer: Self-pay

## 2020-12-17 ENCOUNTER — Other Ambulatory Visit: Payer: Self-pay

## 2020-12-17 ENCOUNTER — Ambulatory Visit (HOSPITAL_COMMUNITY)
Admission: RE | Admit: 2020-12-17 | Discharge: 2020-12-17 | Disposition: A | Discharge status: Home / Self Care | Payer: Medicare Other | Source: Ambulatory Visit | Attending: Cardiology | Admitting: Cardiology

## 2020-12-17 DIAGNOSIS — I341 Nonrheumatic mitral (valve) prolapse: Secondary | ICD-10-CM | POA: Insufficient documentation

## 2020-12-17 DIAGNOSIS — E7800 Pure hypercholesterolemia, unspecified: Secondary | ICD-10-CM | POA: Diagnosis present

## 2020-12-17 DIAGNOSIS — I10 Essential (primary) hypertension: Secondary | ICD-10-CM | POA: Insufficient documentation

## 2020-12-17 DIAGNOSIS — I340 Nonrheumatic mitral (valve) insufficiency: Secondary | ICD-10-CM | POA: Diagnosis present

## 2020-12-17 DIAGNOSIS — R072 Precordial pain: Secondary | ICD-10-CM | POA: Diagnosis not present

## 2020-12-17 MED ORDER — NITROGLYCERIN 0.4 MG SL SUBL
SUBLINGUAL_TABLET | SUBLINGUAL | Status: AC
  Administered 2020-12-17: 0.8 mg via SUBLINGUAL
  Filled 2020-12-17: qty 2

## 2020-12-17 MED ORDER — IOHEXOL 350 MG/ML SOLN
95.0000 mL | Freq: Once | INTRAVENOUS | Status: AC | PRN
  Administered 2020-12-17: 95 mL via INTRAVENOUS

## 2020-12-17 MED ORDER — NITROGLYCERIN 0.4 MG SL SUBL: 0.8000 mg | SUBLINGUAL_TABLET | Freq: Once | SUBLINGUAL | Status: AC

## 2021-01-07 ENCOUNTER — Encounter (INDEPENDENT_AMBULATORY_CARE_PROVIDER_SITE_OTHER): Payer: Self-pay

## 2021-01-07 ENCOUNTER — Ambulatory Visit (HOSPITAL_COMMUNITY): Payer: Medicare Other | Attending: Cardiology

## 2021-01-07 ENCOUNTER — Other Ambulatory Visit: Payer: Self-pay

## 2021-01-07 DIAGNOSIS — I341 Nonrheumatic mitral (valve) prolapse: Secondary | ICD-10-CM | POA: Insufficient documentation

## 2021-01-07 DIAGNOSIS — I340 Nonrheumatic mitral (valve) insufficiency: Secondary | ICD-10-CM | POA: Insufficient documentation

## 2021-01-07 DIAGNOSIS — I10 Essential (primary) hypertension: Secondary | ICD-10-CM | POA: Diagnosis present

## 2021-01-07 DIAGNOSIS — E7800 Pure hypercholesterolemia, unspecified: Secondary | ICD-10-CM | POA: Diagnosis present

## 2021-01-07 DIAGNOSIS — R072 Precordial pain: Secondary | ICD-10-CM | POA: Diagnosis not present

## 2021-01-07 LAB — ECHOCARDIOGRAM COMPLETE
Area-P 1/2: 2.46 cm2
MV M vel: 4.62 m/s
MV Peak grad: 85.4 mmHg
Radius: 1 cm
S' Lateral: 3.3 cm

## 2021-01-08 ENCOUNTER — Other Ambulatory Visit: Payer: Self-pay

## 2021-01-08 DIAGNOSIS — I340 Nonrheumatic mitral (valve) insufficiency: Secondary | ICD-10-CM

## 2021-01-08 DIAGNOSIS — I341 Nonrheumatic mitral (valve) prolapse: Secondary | ICD-10-CM

## 2022-01-07 ENCOUNTER — Ambulatory Visit (HOSPITAL_COMMUNITY): Payer: Medicare Other | Attending: Cardiology

## 2022-01-07 DIAGNOSIS — I340 Nonrheumatic mitral (valve) insufficiency: Secondary | ICD-10-CM

## 2022-01-07 DIAGNOSIS — I341 Nonrheumatic mitral (valve) prolapse: Secondary | ICD-10-CM | POA: Diagnosis not present

## 2022-01-07 LAB — ECHOCARDIOGRAM COMPLETE
AR max vel: 2.24 cm2
AV Peak grad: 8.6 mmHg
Ao pk vel: 1.47 m/s
Area-P 1/2: 2.7 cm2
MV M vel: 4.01 m/s
MV Peak grad: 64.3 mmHg
S' Lateral: 3.3 cm
Single Plane A4C EF: 46.3 %

## 2022-01-10 NOTE — Addendum Note (Signed)
Encounter addended by: Novella Olive on: 01/10/2022 1:27 PM  Actions taken: Letter saved

## 2022-01-15 NOTE — Progress Notes (Unsigned)
Cardiology Office Note   Date:  01/19/2022   ID:  RICHRD DA, DOB 05-29-1936, MRN [id number]  PCP:  Lorene Dy, MD  Cardiologist:   Dywane Peruski [place], MD   Chief Complaint  Patient presents with   Mitral Valve Prolapse    History of Present Illness: Benjamin Jensen is a 86 y.o. male who is seen for follow up CAD. He is a former patient of Dr. Wynonia Lawman. He had a prior abnormal stress test in 2015. This led to a cardiac cath showing only minimal wall irregularities and normal LV function. He has a history of HLD and HTN. He also has MVP with mild to moderate MR.   He had a coronary CTA last year showing no significant CAD.   More recently he had Echo to follow up on his MVP and MR. This showed worsening of his MR to moderate to severe range. He is seen today to discuss.   On follow up today he notes that he does have less stamina in the past. Just gives out more easily. Having said this he is still very active push mowing, gardening with a tiller, digging up stumps and walking his dog. Doesn't really have SOB or chest pain. No edema.  He reports BP at home is elevated 0000000 systolic range.    Past Medical History:  Diagnosis Date   Abnormal stress ECG with treadmill 03/17/2014   Angina pectoris ([place]) 03/17/2014   Essential hypertension 03/17/2014   Hyperlipidemia 03/17/2014   Kidney stones    Lumbar disc disease     Past Surgical History:  Procedure Laterality Date   CARPAL TUNNEL RELEASE     CATARACT EXTRACTION     KIDNEY STONE SURGERY     LEFT HEART CATHETERIZATION WITH CORONARY ANGIOGRAM N/A 03/17/2014   Procedure: LEFT HEART CATHETERIZATION WITH CORONARY ANGIOGRAM;  Surgeon: Jacolyn Reedy, MD;  Location: [facility] CATH LAB;  Service: Cardiovascular;  Laterality: N/A;   LUMBAR LAMINECTOMY     TONSILLECTOMY       Current Outpatient Medications  Medication Sig Dispense Refill   alfuzosin (UROXATRAL) 10 MG 24 hr tablet Take 1 tablet by mouth daily.     amLODipine (NORVASC)  5 MG tablet Take 5 mg by mouth daily.     aspirin EC 81 MG tablet Take 81 mg by mouth daily.     atorvastatin (LIPITOR) 10 MG tablet Take 1 tablet by mouth daily.     Glucosamine HCl (GLUCOSAMINE PO) Take 2 capsules by mouth daily.      Saw Palmetto, Serenoa repens, (SAW PALMETTO PO) Take 1 capsule by mouth daily.     triamterene-hydrochlorothiazide (MAXZIDE-25) 37.5-25 MG tablet Take 1 tablet by mouth daily.     valsartan (DIOVAN) 160 MG tablet Take 1 tablet (160 mg total) by mouth daily. 90 tablet 3   No current facility-administered medications for this visit.    Allergies:   Patient has no known allergies.    Social History:  The patient  reports that he has quit smoking. He has never used smokeless tobacco. He reports that he does not drink alcohol and does not use drugs.   Family History:  The patient's family history includes Heart attack in his father; Heart failure in his mother.    ROS:  Please see the history of present illness.   Otherwise, review of systems are positive for none.   All other systems are reviewed and negative.    PHYSICAL EXAM: VS:  BP Marland Kitchen)  164/83   Pulse (!) 57   Ht 5\' 9"  (1.753 m)   Wt 180 lb 9.6 oz (81.9 kg)   SpO2 97%   BMI 26.67 kg/m  , BMI Body mass index is 26.67 kg/m.  GEN: Well nourished, well developed, in no acute distress  HEENT: normal  Neck: no JVD, carotid bruits, or masses Cardiac: RRR; he has a grade 2/6 holosystolic murmur at the apex.  Respiratory:  clear to auscultation bilaterally, normal work of breathing GI: soft, nontender, nondistended, + BS MS: no deformity or atrophy  Skin: warm and dry, no rash Neuro:  Strength and sensation are intact Psych: euthymic mood, full affect   EKG:  EKG is ordered today. The ekg ordered today demonstrates NSR rate 57.  Otherwise normal.   I have personally reviewed and interpreted this study.   Recent Labs: No results found for requested labs within last 365 days.    Lipid Panel No  results found for: "CHOL", "TRIG", "HDL", "CHOLHDL", "VLDL", "LDLCALC", "LDLDIRECT"    Dated 2/27//20: creatinine 1.2.   Wt Readings from Last 3 Encounters:  01/19/22 180 lb 9.6 oz (81.9 kg)  12/07/20 186 lb 3.2 oz (84.5 kg)  11/19/19 182 lb 12.8 oz (82.9 kg)      Other studies Reviewed: Additional studies/ records that were reviewed today include:   Cardiac Catheterization Report    Benjamin Jensen    86 y.o.  male  DOB: 1936-02-14  MRN: [id number]  03/17/2014      PROCEDURE:  Left heart catheterization with selective coronary angiography, left ventriculogram.   INDICATIONS:  Abnormal treadmill the patient with multiple risk factors and angina pectoris with an abnormal exercise treadmill   The risks, benefits, and details of the procedure were explained to the patient.  The patient verbalized understanding and wanted to proceed.  Informed written consent was obtained.   PROCEDURE TECHNIQUE:  After Xylocaine anesthesia a 97F sheath was placed in the right femoral artery with a single anterior needle wall stick.   Left coronary angiography was done using a Judkins L4 guide catheter.  Right coronary angiography was done using a Judkins R4 guide catheter.  A 30 cc ventriculogram was performed in the 30 degree RAO projection.  Tolerated the procedure well.  Sheath removed in the holding area.   CONTRAST:  Total of 55 cc.   COMPLICATIONS:  None.     HEMODYNAMICS:  Aortic pressure was 136/66; left ventricle postcontrast 136/5-11.  There was no gradient between the left ventricle and aorta.     ANGIOGRAPHIC DATA:     CORONARY ARTERIES:   Arise and distribute normally.  Right dominant. No coronary calcification is noted.   Left main coronary artery: Normal    Left anterior descending: Scattered irregularities but no significant obstructive stenoses noted.   Circumflex coronary artery: Scattered irregularities with no significant obstructive stenosis is noted.   Right coronary  artery: Scattered irregularities.   LEFT VENTRICULOGRAM:  Performed in the 30 RAO projection.  The aortic and mitral valves are normal. The left ventricle is normal in size with normal wall motion. Estimated ejection fraction is 60%.    IMPRESSIONS:   Scattered irregularities but no significant obstructive coronary artery disease noted Normal left ventricle.Marland Kitchen   RECOMMENDATION:  Continued medical therapy, good blood pressure control.Kerry Hough. MD [facility]  Echo 01/25/19: IMPRESSIONS     1. The left ventricle has normal systolic function, with an ejection  fraction of  55-60%. The cavity size was normal. There is mildly increased  left ventricular wall thickness. Left ventricular diastolic Doppler  parameters are consistent with impaired  relaxation.   2. The right ventricle has normal systolic function. The cavity was  normal.   3. Left atrial size was mildly dilated.   4. Moderate mitral valve prolapse.   5. The mitral valve is abnormal. Mild thickening of the mitral valve  leaflet. There is mild mitral annular calcification present. Mitral valve  regurgitation is mild to moderate by color flow Doppler.   6. The tricuspid valve is grossly normal.   7. The aortic valve is tricuspid. Mild calcification of the aortic valve.  Aortic valve regurgitation is trivial by color flow Doppler. No stenosis  of the aortic valve.   8. There is mild dilatation of the ascending aorta measuring 42 mm.   9. Normal LV systolic function; mild diastolic dysfunction; mild LVH;  trace AI; prolapse of posterior MV leaflet with eccentric MR (appears mild  to moderate but may be underestimated due to eccentric nature of jet);  mild LAE.   Echo 01/07/22: IMPRESSIONS     1. Left ventricular ejection fraction, by estimation, is 70 to 75%. The  left ventricle has hyperdynamic function. The left ventricle has no  regional wall motion abnormalities. Left ventricular diastolic parameters  are  consistent with Grade I diastolic  dysfunction (impaired relaxation).   2. Right ventricular systolic function is normal. The right ventricular  size is normal. There is normal pulmonary artery systolic pressure.   3. The mitral valve is normal in structure. Moderate to severe mitral  valve regurgitation. No evidence of mitral stenosis. There is moderate  holosystolic prolapse of the lateral scallop of the posterior leaflet of  the mitral valve.   4. The aortic valve is calcified. There is moderate calcification of the  aortic valve. There is mild thickening of the aortic valve. Aortic valve  regurgitation is trivial. Aortic valve sclerosis is present, with no  evidence of aortic valve stenosis.   5. Pulmonic valve regurgitation is moderate.   6. There is mild dilatation of the ascending aorta, measuring 42 mm.   7. The inferior vena cava is normal in size with greater than 50%  respiratory variability, suggesting right atrial pressure of 3 mmHg.  ADDENDUM REPORT: 12/17/2020 16:00   CLINICAL DATA:  29M with hypertension, hyperlipidemia, and coronary calcification.   EXAM: Cardiac/Coronary  CT   TECHNIQUE: The patient was scanned on a [employer].   FINDINGS: A 120 kV prospective scan was triggered in the descending thoracic aorta at 111 HU's. Axial non-contrast 3 mm slices were carried out through the heart. The data set was analyzed on a dedicated work station and scored using the [place]. Gantry rotation speed was 250 msecs and collimation was .6 mm. No beta blockade and 0.8 mg of sl NTG was given. The 3D data set was reconstructed in 5% intervals of the 67-82 % of the R-R cycle. Diastolic phases were analyzed on a dedicated work station using MPR, MIP and VRT modes. The patient received 80 cc of contrast.   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 Calcification of the aortic root and the descending aorta. No dissection.   Aortic Valve:  Trileaflet.   Mild calcification of the aortic valve.   Coronary Arteries:  Normal coronary origin.  Right dominance.   RCA is a large dominant artery that gives rise to PDA and PLVB. There is no  plaque.   Left main is a large artery that gives rise to LAD and LCX arteries.   LAD is a large vessel that has minimal (<25%) soft plaque in the proximal to mid LAD. There are two diagonals without plaque.   LCX is a non-dominant artery that gives rise to one large OM1 branch. There is no plaque.   Coronary Calcium Score:   Left main: 0   Left anterior descending artery: 15.1   Left circumflex artery: 0   Right coronary artery: 0   Total: 15.1   Percentile: 4th   Other findings:   Normal pulmonary vein drainage into the left atrium.   Normal let atrial appendage without a thrombus.   Normal size of the pulmonary artery.   IMPRESSION: 1. Coronary calcium score of 15.1. This was 4th percentile for age-, race-, and sex-matched controls.   2. Normal coronary origin with right dominance.   3. No evidence of obstructive CAD.   Skeet Latch, MD     Electronically Signed   By: Skeet Latch   On: 12/17/2020 16:00        ASSESSMENT AND PLAN:  1.  CAD. Minimal Nonobstructive by cath in 2015 and by CTA last year.Continue ASA, statin therapy  2. HLD. On lipitor. Labs followed by Dr Mancel Bale. We will request a copy.  3. HTN. Poorly controlled. On amlodpine and Maxide.will add valsartan 160 mg daily. Keep BP monitor. Check BMET in 2 weeks. Sodium restriction.  Not a candidate for beta blocker due to bradycardia.   4. MVP with mild to moderate MR by Echo in 2020. Now moderate to severe with holosystolic prolapse of the posterior leaflet. I personally reviewed Echo. His MR is directed anteriorly making it difficult to assess severity. It is at least moderate. By exam it doesn't sound severe. He is mildly symptomatic if at all. It may be that his uncontrolled HTN is playing a role.  Will monitor as his BP is better controlled. Unless symptoms worsen I would just repeat Echo in one year. At his age I would not pursue MV repair or Mitraclip unless clinical conditions worsen.    Current medicines are reviewed at length with the patient today.  The patient does not have concerns regarding medicines.   Disposition:   FU 3 months  Signed, Blaike Vickers [place], MD  01/19/2022 4:46 PM    [facility] [address], [place], [place], [address] Phone [phone], Fax [phone]

## 2022-01-19 ENCOUNTER — Encounter: Payer: Self-pay | Admitting: Cardiology

## 2022-01-19 ENCOUNTER — Other Ambulatory Visit: Payer: Self-pay | Admitting: *Deleted

## 2022-01-19 ENCOUNTER — Ambulatory Visit: Payer: Medicare Other | Admitting: Cardiology

## 2022-01-19 VITALS — BP 164/83 | HR 57 | Ht 69.0 in | Wt 180.6 lb

## 2022-01-19 DIAGNOSIS — I340 Nonrheumatic mitral (valve) insufficiency: Secondary | ICD-10-CM

## 2022-01-19 DIAGNOSIS — I10 Essential (primary) hypertension: Secondary | ICD-10-CM

## 2022-01-19 DIAGNOSIS — I341 Nonrheumatic mitral (valve) prolapse: Secondary | ICD-10-CM

## 2022-01-19 DIAGNOSIS — E7800 Pure hypercholesterolemia, unspecified: Secondary | ICD-10-CM | POA: Diagnosis not present

## 2022-01-19 MED ORDER — VALSARTAN 160 MG PO TABS: 160.0000 mg | ORAL_TABLET | Freq: Every day | ORAL | 3 refills | Status: DC

## 2022-01-19 NOTE — Patient Instructions (Addendum)
Medication Instructions:    START TAKING Valsartan 160 mg  daily  *If you need a refill on your cardiac medications before your next appointment, please call your pharmacy*   Lab Work:  [facility] - in 2 weeks ( @ 02/02/22)   If you have labs (blood work) drawn today and your tests are completely normal, you will receive your results only by: MyChart Message (if you have MyChart) OR A paper copy in the mail If you have any lab test that is abnormal or we need to change your treatment, we will call you to review the results.   Testing/Procedures: Not needed   Follow-Up: At [facility], you and your health needs are our priority.  As part of our continuing mission to provide you with exceptional heart care, we have created designated Provider Care Teams.  These Care Teams include your primary Cardiologist (physician) and Advanced Practice Providers (APPs -  Physician Assistants and Nurse Practitioners) who all work together to provide you with the care you need, when you need it.  We recommend signing up for the patient portal called "MyChart".  Sign up information is provided on this After Visit Summary.  MyChart is used to connect with patients for Virtual Visits (Telemedicine).  Patients are able to view lab/test results, encounter notes, upcoming appointments, etc.  Non-urgent messages can be sent to your provider as well.   To learn more about what you can do with MyChart, go to [email or web address].    Your next appointment:   3 month(s)  The format for your next appointment:   In Person  Provider:   None     Other instruction   Keep a blood pressure diary and bring readings to next office visit   Important Information About Sugar

## 2022-01-21 ENCOUNTER — Telehealth: Payer: Self-pay | Admitting: Cardiology

## 2022-01-21 NOTE — Telephone Encounter (Signed)
-  Pt called stating he was seen in office on 6/14 and believe elevated BP was related to anxiety of ECHO results. -Pt stated BP is normally WNL and provided readings below -Pt report he has not started valsartan and inquiring if MD would like for him to hold off on new medications for now.    6/15: 115/69 112/64 (after walk dog) 99/61 77 116/90  109/67 80 141/74 70  136/75 72 125/81 76 (before bed)  6/16 128/71 119/69 (after walking dog)  Will forward for review

## 2022-01-21 NOTE — Telephone Encounter (Signed)
Pt c/o medication issue:  1. Name of Medication:  valsartan (DIOVAN) 160 MG tablet  2. How are you currently taking this medication (dosage and times per day)? Not taking currently  3. Are you having a reaction (difficulty breathing--STAT)? No  4. What is your medication issue?  Patient stated he is already taking 2 BP meds and was prescribed a third medication at his office visit this week.  He stated his BP is running low - 99/61 HR 77 yesterday evening and his HR usually runs in the 70's.  Patient would like to know if he should still take this new medication.

## 2022-01-21 NOTE — Telephone Encounter (Signed)
-  Pt updated and verbalized understanding. -Valsartan d/c  [place], Peter M, MD  You 2 hours ago (10:27 AM)    OK we can hold off and just monitor for now   Peter [place] MD, [facility]

## 2022-02-01 LAB — BASIC METABOLIC PANEL
BUN/Creatinine Ratio: 19 (ref 10–24)
BUN: 25 mg/dL (ref 8–27)
CO2: 24 mmol/L (ref 20–29)
Calcium: 9.1 mg/dL (ref 8.6–10.2)
Chloride: 105 mmol/L (ref 96–106)
Creatinine, Ser: 1.33 mg/dL — ABNORMAL HIGH (ref 0.76–1.27)
Glucose: 104 mg/dL — ABNORMAL HIGH (ref 70–99)
Potassium: 3.9 mmol/L (ref 3.5–5.2)
Sodium: 143 mmol/L (ref 134–144)
eGFR: 53 mL/min/{1.73_m2} — ABNORMAL LOW (ref >59)

## 2022-04-19 NOTE — H&P (View-Only) (Signed)
Cardiology Office Note   Date:  04/22/2022   ID:  Benjamin Jensen, DOB 05/20/1936, MRN [id number]  PCP:  Benjamin Apley, MD  Cardiologist:   Benjamin Luckett [place], MD   No chief complaint on file.   History of Present Illness: Benjamin Jensen is a 86 y.o. male who is seen for follow up CAD. He is a former patient of Dr. Donnie Jensen. He had a prior abnormal stress test in 2015. This led to a cardiac cath showing only minimal wall irregularities and normal LV function. He has a history of HLD and HTN. He also has MVP with mild to moderate MR.   He had a coronary CTA last year showing no significant CAD.   More recently he had Echo to follow up on his MVP and MR. This showed worsening of his MR to moderate to severe range. When last seen he really denied any symptoms and we elected not to proceed with further evaluation  On follow up today he reports that he is now getting SOB easily and is more fatigued. Dr Benjamin Jensen held his lipitor and reduced his Maxide dose to see if this would help but he has noted no improvement. No chest pain or palpitations.    Past Medical History:  Diagnosis Date   Abnormal stress ECG with treadmill 03/17/2014   Angina pectoris (HCC) 03/17/2014   Essential hypertension 03/17/2014   Hyperlipidemia 03/17/2014   Kidney stones    Lumbar disc disease     Past Surgical History:  Procedure Laterality Date   CARPAL TUNNEL RELEASE     CATARACT EXTRACTION     KIDNEY STONE SURGERY     LEFT HEART CATHETERIZATION WITH CORONARY ANGIOGRAM N/A 03/17/2014   Procedure: LEFT HEART CATHETERIZATION WITH CORONARY ANGIOGRAM;  Surgeon: Benjamin Boyer, MD;  Location: [facility] CATH LAB;  Service: Cardiovascular;  Laterality: N/A;   LUMBAR LAMINECTOMY     TONSILLECTOMY       Current Outpatient Medications  Medication Sig Dispense Refill   alfuzosin (UROXATRAL) 10 MG 24 hr tablet Take 1 tablet by mouth daily.     amLODipine (NORVASC) 5 MG tablet Take 5 mg by mouth daily.     aspirin EC 81 MG  tablet Take 81 mg by mouth daily.     Glucosamine HCl (GLUCOSAMINE PO) Take 2 capsules by mouth daily.      Saw Palmetto, Serenoa repens, (SAW PALMETTO PO) Take 1 capsule by mouth daily.     triamterene-hydrochlorothiazide (MAXZIDE-25) 37.5-25 MG tablet Take 1 tablet by mouth daily. 3 days a week     atorvastatin (LIPITOR) 10 MG tablet Take 1 tablet by mouth daily. (Patient not taking: Reported on 04/22/2022)     No current facility-administered medications for this visit.    Allergies:   Patient has no known allergies.    Social History:  The patient  reports that he has quit smoking. He has never used smokeless tobacco. He reports that he does not drink alcohol and does not use drugs.   Family History:  The patient's family history includes Heart attack in his father; Heart failure in his mother.    ROS:  Please see the history of present illness.   Otherwise, review of systems are positive for none.   All other systems are reviewed and negative.    PHYSICAL EXAM: VS:  BP 138/84   Pulse 70   Ht 5\' 10"  (1.778 m)   Wt 183 lb 3.2 oz (83.1 kg)  SpO2 98%   BMI 26.29 kg/m  , BMI Body mass index is 26.29 kg/m.  GEN: Well nourished, well developed, in no acute distress  HEENT: normal  Neck: no JVD, carotid bruits, or masses Cardiac: RRR; he has a grade 2/6 holosystolic murmur at the apex.  Respiratory:  clear to auscultation bilaterally, normal work of breathing GI: soft, nontender, nondistended, + BS MS: no deformity or atrophy  Skin: warm and dry, no rash Neuro:  Strength and sensation are intact Psych: euthymic mood, full affect   EKG:  EKG is ordered today. The ekg ordered today demonstrates NSR rate 57.  Otherwise normal.   I have personally reviewed and interpreted this study.   Recent Labs: 02/01/2022: BUN 25; Creatinine, Ser 1.33; Potassium 3.9; Sodium 143    Lipid Panel No results found for: "CHOL", "TRIG", "HDL", "CHOLHDL", "VLDL", "LDLCALC", "LDLDIRECT"     Dated 2/27//20: creatinine 1.2.   Wt Readings from Last 3 Encounters:  04/22/22 183 lb 3.2 oz (83.1 kg)  01/19/22 180 lb 9.6 oz (81.9 kg)  12/07/20 186 lb 3.2 oz (84.5 kg)      Other studies Reviewed: Additional studies/ records that were reviewed today include:   Cardiac Catheterization Report    Benjamin Jensen    86 y.o.  male  DOB: 08/29/1935  MRN: [id number]  03/17/2014      PROCEDURE:  Left heart catheterization with selective coronary angiography, left ventriculogram.   INDICATIONS:  Abnormal treadmill the patient with multiple risk factors and angina pectoris with an abnormal exercise treadmill   The risks, benefits, and details of the procedure were explained to the patient.  The patient verbalized understanding and wanted to proceed.  Informed written consent was obtained.   PROCEDURE TECHNIQUE:  After Xylocaine anesthesia a 5F sheath was placed in the right femoral artery with a single anterior needle wall stick.   Left coronary angiography was done using a Judkins L4 guide catheter.  Right coronary angiography was done using a Judkins R4 guide catheter.  A 30 cc ventriculogram was performed in the 30 degree RAO projection.  Tolerated the procedure well.  Sheath removed in the holding area.   CONTRAST:  Total of 55 cc.   COMPLICATIONS:  None.     HEMODYNAMICS:  Aortic pressure was 136/66; left ventricle postcontrast 136/5-11.  There was no gradient between the left ventricle and aorta.     ANGIOGRAPHIC DATA:     CORONARY ARTERIES:   Arise and distribute normally.  Right dominant. No coronary calcification is noted.   Left main coronary artery: Normal    Left anterior descending: Scattered irregularities but no significant obstructive stenoses noted.   Circumflex coronary artery: Scattered irregularities with no significant obstructive stenosis is noted.   Right coronary artery: Scattered irregularities.   LEFT VENTRICULOGRAM:  Performed in the 30 RAO  projection.  The aortic and mitral valves are normal. The left ventricle is normal in size with normal wall motion. Estimated ejection fraction is 60%.    IMPRESSIONS:   Scattered irregularities but no significant obstructive coronary artery disease noted Normal left ventricle..   RECOMMENDATION:  Continued medical therapy, good blood pressure control..     W. Spencer Tilley, Jr. MD FACC  Echo 01/25/19: IMPRESSIONS     1. The left ventricle has normal systolic function, with an ejection  fraction of 55-60%. The cavity size was normal. There is mildly increased  left ventricular wall thickness. Left ventricular diastolic Doppler  parameters are consistent with   impaired  relaxation.   2. The right ventricle has normal systolic function. The cavity was  normal.   3. Left atrial size was mildly dilated.   4. Moderate mitral valve prolapse.   5. The mitral valve is abnormal. Mild thickening of the mitral valve  leaflet. There is mild mitral annular calcification present. Mitral valve  regurgitation is mild to moderate by color flow Doppler.   6. The tricuspid valve is grossly normal.   7. The aortic valve is tricuspid. Mild calcification of the aortic valve.  Aortic valve regurgitation is trivial by color flow Doppler. No stenosis  of the aortic valve.   8. There is mild dilatation of the ascending aorta measuring 42 mm.   9. Normal LV systolic function; mild diastolic dysfunction; mild LVH;  trace AI; prolapse of posterior MV leaflet with eccentric MR (appears mild  to moderate but may be underestimated due to eccentric nature of jet);  mild LAE.   Echo 01/07/22: IMPRESSIONS     1. Left ventricular ejection fraction, by estimation, is 70 to 75%. The  left ventricle has hyperdynamic function. The left ventricle has no  regional wall motion abnormalities. Left ventricular diastolic parameters  are consistent with Grade I diastolic  dysfunction (impaired relaxation).   2. Right  ventricular systolic function is normal. The right ventricular  size is normal. There is normal pulmonary artery systolic pressure.   3. The mitral valve is normal in structure. Moderate to severe mitral  valve regurgitation. No evidence of mitral stenosis. There is moderate  holosystolic prolapse of the lateral scallop of the posterior leaflet of  the mitral valve.   4. The aortic valve is calcified. There is moderate calcification of the  aortic valve. There is mild thickening of the aortic valve. Aortic valve  regurgitation is trivial. Aortic valve sclerosis is present, with no  evidence of aortic valve stenosis.   5. Pulmonic valve regurgitation is moderate.   6. There is mild dilatation of the ascending aorta, measuring 42 mm.   7. The inferior vena cava is normal in size with greater than 50%  respiratory variability, suggesting right atrial pressure of 3 mmHg.  ADDENDUM REPORT: 12/17/2020 16:00   CLINICAL DATA:  72M with hypertension, hyperlipidemia, and coronary calcification.   EXAM: Cardiac/Coronary  CT   TECHNIQUE: The patient was scanned on a [employer].   FINDINGS: A 120 kV prospective scan was triggered in the descending thoracic aorta at 111 HU's. Axial non-contrast 3 mm slices were carried out through the heart. The data set was analyzed on a dedicated work station and scored using the Agatson method. Gantry rotation speed was 250 msecs and collimation was .6 mm. No beta blockade and 0.8 mg of sl NTG was given. The 3D data set was reconstructed in 5% intervals of the 67-82 % of the R-R cycle. Diastolic phases were analyzed on a dedicated work station using MPR, MIP and VRT modes. The patient received 80 cc of contrast.   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 Calcification of the aortic root and the descending aorta. No dissection.   Aortic Valve:  Trileaflet.  Mild calcification of the aortic valve.   Coronary Arteries:  Normal coronary  origin.  Right dominance.   RCA is a large dominant artery that gives rise to PDA and PLVB. There is no plaque.   Left main is a large artery that gives rise to LAD and LCX arteries.   LAD is a large vessel  that has minimal (<25%) soft plaque in the proximal to mid LAD. There are two diagonals without plaque.   LCX is a non-dominant artery that gives rise to one large OM1 branch. There is no plaque.   Coronary Calcium Score:   Left main: 0   Left anterior descending artery: 15.1   Left circumflex artery: 0   Right coronary artery: 0   Total: 15.1   Percentile: 4th   Other findings:   Normal pulmonary vein drainage into the left atrium.   Normal let atrial appendage without a thrombus.   Normal size of the pulmonary artery.   IMPRESSION: 1. Coronary calcium score of 15.1. This was 4th percentile for age-, race-, and sex-matched controls.   2. Normal coronary origin with right dominance.   3. No evidence of obstructive CAD.   Skeet Latch, MD     Electronically Signed   By: Skeet Latch   On: 12/17/2020 16:00        ASSESSMENT AND PLAN:  1.  CAD. Minimal Nonobstructive by cath in 2015 and by CTA in May 2022. Continue ASA, statin therapy  2. HLD. On lipitor. Labs followed by Dr Mancel Bale. We will request a copy. Recommend he resume lipitor since symptoms have not improved off it.   3. HTN. Control has improved. Continue current meds.  Sodium restriction.  Not a candidate for beta blocker due to bradycardia.   4. MVP with mild to moderate MR by Echo in 2020. Now moderate to severe with holosystolic prolapse of the posterior leaflet. I personally reviewed Echo. His MR is directed anteriorly making it difficult to assess severity. It is at least moderate. He is now more symptomatic with SOB and fatigue.  No improvement with better BP control. I recommend doing a TEE at this point to better assess valve structure and degree of MR. If severe will need to  consider MV repair versus Mitraclip.    Current medicines are reviewed at length with the patient today.  The patient does not have concerns regarding medicines.   Disposition:   FU post TEE.   Signed, Vaishnavi Dalby [place], MD  04/22/2022 11:27 AM    [facility] [address] [place], [place], [address] Phone [phone], Fax [phone]

## 2022-04-19 NOTE — Progress Notes (Signed)
Cardiology Office Note   Date:  04/22/2022   ID:  TORSTEN WENIGER, DOB 05/20/1936, MRN [id number]  PCP:  Burton Apley, MD  Cardiologist:   Shondell Fabel [place], MD   No chief complaint on file.   History of Present Illness: MACLOVIO HENSON is a 86 y.o. male who is seen for follow up CAD. He is a former patient of Dr. Donnie Aho. He had a prior abnormal stress test in 2015. This led to a cardiac cath showing only minimal wall irregularities and normal LV function. He has a history of HLD and HTN. He also has MVP with mild to moderate MR.   He had a coronary CTA last year showing no significant CAD.   More recently he had Echo to follow up on his MVP and MR. This showed worsening of his MR to moderate to severe range. When last seen he really denied any symptoms and we elected not to proceed with further evaluation  On follow up today he reports that he is now getting SOB easily and is more fatigued. Dr Su Hilt held his lipitor and reduced his Maxide dose to see if this would help but he has noted no improvement. No chest pain or palpitations.    Past Medical History:  Diagnosis Date   Abnormal stress ECG with treadmill 03/17/2014   Angina pectoris (HCC) 03/17/2014   Essential hypertension 03/17/2014   Hyperlipidemia 03/17/2014   Kidney stones    Lumbar disc disease     Past Surgical History:  Procedure Laterality Date   CARPAL TUNNEL RELEASE     CATARACT EXTRACTION     KIDNEY STONE SURGERY     LEFT HEART CATHETERIZATION WITH CORONARY ANGIOGRAM N/A 03/17/2014   Procedure: LEFT HEART CATHETERIZATION WITH CORONARY ANGIOGRAM;  Surgeon: Othella Boyer, MD;  Location: [facility] CATH LAB;  Service: Cardiovascular;  Laterality: N/A;   LUMBAR LAMINECTOMY     TONSILLECTOMY       Current Outpatient Medications  Medication Sig Dispense Refill   alfuzosin (UROXATRAL) 10 MG 24 hr tablet Take 1 tablet by mouth daily.     amLODipine (NORVASC) 5 MG tablet Take 5 mg by mouth daily.     aspirin EC 81 MG  tablet Take 81 mg by mouth daily.     Glucosamine HCl (GLUCOSAMINE PO) Take 2 capsules by mouth daily.      Saw Palmetto, Serenoa repens, (SAW PALMETTO PO) Take 1 capsule by mouth daily.     triamterene-hydrochlorothiazide (MAXZIDE-25) 37.5-25 MG tablet Take 1 tablet by mouth daily. 3 days a week     atorvastatin (LIPITOR) 10 MG tablet Take 1 tablet by mouth daily. (Patient not taking: Reported on 04/22/2022)     No current facility-administered medications for this visit.    Allergies:   Patient has no known allergies.    Social History:  The patient  reports that he has quit smoking. He has never used smokeless tobacco. He reports that he does not drink alcohol and does not use drugs.   Family History:  The patient's family history includes Heart attack in his father; Heart failure in his mother.    ROS:  Please see the history of present illness.   Otherwise, review of systems are positive for none.   All other systems are reviewed and negative.    PHYSICAL EXAM: VS:  BP 138/84   Pulse 70   Ht 5\' 10"  (1.778 m)   Wt 183 lb 3.2 oz (83.1 kg)  SpO2 98%   BMI 26.29 kg/m  , BMI Body mass index is 26.29 kg/m.  GEN: Well nourished, well developed, in no acute distress  HEENT: normal  Neck: no JVD, carotid bruits, or masses Cardiac: RRR; he has a grade 2/6 holosystolic murmur at the apex.  Respiratory:  clear to auscultation bilaterally, normal work of breathing GI: soft, nontender, nondistended, + BS MS: no deformity or atrophy  Skin: warm and dry, no rash Neuro:  Strength and sensation are intact Psych: euthymic mood, full affect   EKG:  EKG is ordered today. The ekg ordered today demonstrates NSR rate 57.  Otherwise normal.   I have personally reviewed and interpreted this study.   Recent Labs: 02/01/2022: BUN 25; Creatinine, Ser 1.33; Potassium 3.9; Sodium 143    Lipid Panel No results found for: "CHOL", "TRIG", "HDL", "CHOLHDL", "VLDL", "LDLCALC", "LDLDIRECT"     Dated 2/27//20: creatinine 1.2.   Wt Readings from Last 3 Encounters:  04/22/22 183 lb 3.2 oz (83.1 kg)  01/19/22 180 lb 9.6 oz (81.9 kg)  12/07/20 186 lb 3.2 oz (84.5 kg)      Other studies Reviewed: Additional studies/ records that were reviewed today include:   Cardiac Catheterization Report    HERALD PUGMIRE    86 y.o.  male  DOB: 11/02/1935  MRN: [id number]  03/17/2014      PROCEDURE:  Left heart catheterization with selective coronary angiography, left ventriculogram.   INDICATIONS:  Abnormal treadmill the patient with multiple risk factors and angina pectoris with an abnormal exercise treadmill   The risks, benefits, and details of the procedure were explained to the patient.  The patient verbalized understanding and wanted to proceed.  Informed written consent was obtained.   PROCEDURE TECHNIQUE:  After Xylocaine anesthesia a 27F sheath was placed in the right femoral artery with a single anterior needle wall stick.   Left coronary angiography was done using a Judkins L4 guide catheter.  Right coronary angiography was done using a Judkins R4 guide catheter.  A 30 cc ventriculogram was performed in the 30 degree RAO projection.  Tolerated the procedure well.  Sheath removed in the holding area.   CONTRAST:  Total of 55 cc.   COMPLICATIONS:  None.     HEMODYNAMICS:  Aortic pressure was 136/66; left ventricle postcontrast 136/5-11.  There was no gradient between the left ventricle and aorta.     ANGIOGRAPHIC DATA:     CORONARY ARTERIES:   Arise and distribute normally.  Right dominant. No coronary calcification is noted.   Left main coronary artery: Normal    Left anterior descending: Scattered irregularities but no significant obstructive stenoses noted.   Circumflex coronary artery: Scattered irregularities with no significant obstructive stenosis is noted.   Right coronary artery: Scattered irregularities.   LEFT VENTRICULOGRAM:  Performed in the 30 RAO  projection.  The aortic and mitral valves are normal. The left ventricle is normal in size with normal wall motion. Estimated ejection fraction is 60%.    IMPRESSIONS:   Scattered irregularities but no significant obstructive coronary artery disease noted Normal left ventricle.Marland Kitchen   RECOMMENDATION:  Continued medical therapy, good blood pressure control.Kerry Hough. MD [facility]  Echo 01/25/19: IMPRESSIONS     1. The left ventricle has normal systolic function, with an ejection  fraction of 55-60%. The cavity size was normal. There is mildly increased  left ventricular wall thickness. Left ventricular diastolic Doppler  parameters are consistent with  impaired  relaxation.   2. The right ventricle has normal systolic function. The cavity was  normal.   3. Left atrial size was mildly dilated.   4. Moderate mitral valve prolapse.   5. The mitral valve is abnormal. Mild thickening of the mitral valve  leaflet. There is mild mitral annular calcification present. Mitral valve  regurgitation is mild to moderate by color flow Doppler.   6. The tricuspid valve is grossly normal.   7. The aortic valve is tricuspid. Mild calcification of the aortic valve.  Aortic valve regurgitation is trivial by color flow Doppler. No stenosis  of the aortic valve.   8. There is mild dilatation of the ascending aorta measuring 42 mm.   9. Normal LV systolic function; mild diastolic dysfunction; mild LVH;  trace AI; prolapse of posterior MV leaflet with eccentric MR (appears mild  to moderate but may be underestimated due to eccentric nature of jet);  mild LAE.   Echo 01/07/22: IMPRESSIONS     1. Left ventricular ejection fraction, by estimation, is 70 to 75%. The  left ventricle has hyperdynamic function. The left ventricle has no  regional wall motion abnormalities. Left ventricular diastolic parameters  are consistent with Grade I diastolic  dysfunction (impaired relaxation).   2. Right  ventricular systolic function is normal. The right ventricular  size is normal. There is normal pulmonary artery systolic pressure.   3. The mitral valve is normal in structure. Moderate to severe mitral  valve regurgitation. No evidence of mitral stenosis. There is moderate  holosystolic prolapse of the lateral scallop of the posterior leaflet of  the mitral valve.   4. The aortic valve is calcified. There is moderate calcification of the  aortic valve. There is mild thickening of the aortic valve. Aortic valve  regurgitation is trivial. Aortic valve sclerosis is present, with no  evidence of aortic valve stenosis.   5. Pulmonic valve regurgitation is moderate.   6. There is mild dilatation of the ascending aorta, measuring 42 mm.   7. The inferior vena cava is normal in size with greater than 50%  respiratory variability, suggesting right atrial pressure of 3 mmHg.  ADDENDUM REPORT: 12/17/2020 16:00   CLINICAL DATA:  72M with hypertension, hyperlipidemia, and coronary calcification.   EXAM: Cardiac/Coronary  CT   TECHNIQUE: The patient was scanned on a [employer].   FINDINGS: A 120 kV prospective scan was triggered in the descending thoracic aorta at 111 HU's. Axial non-contrast 3 mm slices were carried out through the heart. The data set was analyzed on a dedicated work station and scored using the Agatson method. Gantry rotation speed was 250 msecs and collimation was .6 mm. No beta blockade and 0.8 mg of sl NTG was given. The 3D data set was reconstructed in 5% intervals of the 67-82 % of the R-R cycle. Diastolic phases were analyzed on a dedicated work station using MPR, MIP and VRT modes. The patient received 80 cc of contrast.   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 Calcification of the aortic root and the descending aorta. No dissection.   Aortic Valve:  Trileaflet.  Mild calcification of the aortic valve.   Coronary Arteries:  Normal coronary  origin.  Right dominance.   RCA is a large dominant artery that gives rise to PDA and PLVB. There is no plaque.   Left main is a large artery that gives rise to LAD and LCX arteries.   LAD is a large vessel  that has minimal (<25%) soft plaque in the proximal to mid LAD. There are two diagonals without plaque.   LCX is a non-dominant artery that gives rise to one large OM1 branch. There is no plaque.   Coronary Calcium Score:   Left main: 0   Left anterior descending artery: 15.1   Left circumflex artery: 0   Right coronary artery: 0   Total: 15.1   Percentile: 4th   Other findings:   Normal pulmonary vein drainage into the left atrium.   Normal let atrial appendage without a thrombus.   Normal size of the pulmonary artery.   IMPRESSION: 1. Coronary calcium score of 15.1. This was 4th percentile for age-, race-, and sex-matched controls.   2. Normal coronary origin with right dominance.   3. No evidence of obstructive CAD.   Skeet Latch, MD     Electronically Signed   By: Skeet Latch   On: 12/17/2020 16:00        ASSESSMENT AND PLAN:  1.  CAD. Minimal Nonobstructive by cath in 2015 and by CTA in May 2022. Continue ASA, statin therapy  2. HLD. On lipitor. Labs followed by Dr Mancel Bale. We will request a copy. Recommend he resume lipitor since symptoms have not improved off it.   3. HTN. Control has improved. Continue current meds.  Sodium restriction.  Not a candidate for beta blocker due to bradycardia.   4. MVP with mild to moderate MR by Echo in 2020. Now moderate to severe with holosystolic prolapse of the posterior leaflet. I personally reviewed Echo. His MR is directed anteriorly making it difficult to assess severity. It is at least moderate. He is now more symptomatic with SOB and fatigue.  No improvement with better BP control. I recommend doing a TEE at this point to better assess valve structure and degree of MR. If severe will need to  consider MV repair versus Mitraclip.    Current medicines are reviewed at length with the patient today.  The patient does not have concerns regarding medicines.   Disposition:   FU post TEE.   Signed, Taylorann Tkach [place], MD  04/22/2022 11:27 AM    [facility] [address], [place], [place], [address] Phone [phone], Fax [phone]

## 2022-04-22 ENCOUNTER — Ambulatory Visit: Payer: Medicare Other | Attending: Cardiology | Admitting: Cardiology

## 2022-04-22 ENCOUNTER — Other Ambulatory Visit: Payer: Self-pay | Admitting: Cardiology

## 2022-04-22 ENCOUNTER — Encounter: Payer: Self-pay | Admitting: Cardiology

## 2022-04-22 VITALS — BP 138/84 | HR 70 | Ht 70.0 in | Wt 183.2 lb

## 2022-04-22 DIAGNOSIS — I10 Essential (primary) hypertension: Secondary | ICD-10-CM

## 2022-04-22 DIAGNOSIS — I340 Nonrheumatic mitral (valve) insufficiency: Secondary | ICD-10-CM | POA: Diagnosis not present

## 2022-04-22 DIAGNOSIS — E7800 Pure hypercholesterolemia, unspecified: Secondary | ICD-10-CM

## 2022-04-22 DIAGNOSIS — Z01812 Encounter for preprocedural laboratory examination: Secondary | ICD-10-CM

## 2022-04-22 DIAGNOSIS — I341 Nonrheumatic mitral (valve) prolapse: Secondary | ICD-10-CM

## 2022-04-22 NOTE — Patient Instructions (Signed)
Medication Instructions:  Continue all medications *If you need a refill on your cardiac medications before your next appointment, please call your pharmacy*   Lab Work: Bmet,cbc,pt today   Testing/Procedures: TEE   Follow instructions below   Follow-Up: At [facility], you and your health needs are our priority.  As part of our continuing mission to provide you with exceptional heart care, we have created designated Provider Care Teams.  These Care Teams include your primary Cardiologist (physician) and Advanced Practice Providers (APPs -  Physician Assistants and Nurse Practitioners) who all work together to provide you with the care you need, when you need it.  We recommend signing up for the patient portal called "MyChart".  Sign up information is provided on this After Visit Summary.  MyChart is used to connect with patients for Virtual Visits (Telemedicine).  Patients are able to view lab/test results, encounter notes, upcoming appointments, etc.  Non-urgent messages can be sent to your provider as well.   To learn more about what you can do with MyChart, go to [email or web address].    Your next appointment: After Test     The format for your next appointment: Office    Provider:  Dr.Jordan     You are scheduled for a TEE on Thursday 05/05/22 with Dr. Cristal Deer.  Please arrive at the [facility] (Main Entrance A) at [facility]: [address] [place], [place] [address] at 1:00 pm.   DIET: Nothing to eat or drink after midnight except a sip of water with medications (see medication instructions below)  FYI: For your safety, and to allow [place] to monitor your vital signs accurately during the surgery/procedure we request that   if you have artificial nails, gel coating, SNS etc. Please have those removed prior to your surgery/procedure. Not having the nail coverings /polish removed may result in cancellation or delay of your  surgery/procedure.   Medication Instructions:  Hold Triamterene/HCTZ morning of procedure.    You must have a responsible person to drive you home and stay in the waiting area during your procedure. Failure to do so could result in cancellation.  Bring your insurance cards.  *Special Note: Every effort is made to have your procedure done on time. Occasionally there are emergencies that occur at the hospital that may cause delays. Please be patient if a delay does occur.       Important Information About Sugar

## 2022-04-22 NOTE — Addendum Note (Signed)
Addended by: Neoma Laming on: 04/22/2022 11:41 AM   Modules accepted: Orders

## 2022-04-23 LAB — BASIC METABOLIC PANEL
BUN/Creatinine Ratio: 20 (ref 10–24)
BUN: 29 mg/dL — ABNORMAL HIGH (ref 8–27)
CO2: 23 mmol/L (ref 20–29)
Calcium: 9.1 mg/dL (ref 8.6–10.2)
Chloride: 103 mmol/L (ref 96–106)
Creatinine, Ser: 1.45 mg/dL — ABNORMAL HIGH (ref 0.76–1.27)
Glucose: 80 mg/dL (ref 70–99)
Potassium: 4.2 mmol/L (ref 3.5–5.2)
Sodium: 138 mmol/L (ref 134–144)
eGFR: 48 mL/min/{1.73_m2} — ABNORMAL LOW (ref >59)

## 2022-04-23 LAB — CBC WITH DIFFERENTIAL/PLATELET
Basophils Absolute: 0.1 10*3/uL (ref 0.0–0.2)
Basos: 1 %
EOS (ABSOLUTE): 0 10*3/uL (ref 0.0–0.4)
Eos: 1 %
Hematocrit: 39.8 % (ref 37.5–51.0)
Hemoglobin: 13.1 g/dL (ref 13.0–17.7)
Immature Grans (Abs): 0 10*3/uL (ref 0.0–0.1)
Immature Granulocytes: 1 %
Lymphocytes Absolute: 1.5 10*3/uL (ref 0.7–3.1)
Lymphs: 24 %
MCH: 28.3 pg (ref 26.6–33.0)
MCHC: 32.9 g/dL (ref 31.5–35.7)
MCV: 86 fL (ref 79–97)
Monocytes Absolute: 1.6 10*3/uL — ABNORMAL HIGH (ref 0.1–0.9)
Monocytes: 26 %
Neutrophils Absolute: 3.1 10*3/uL (ref 1.4–7.0)
Neutrophils: 47 %
Platelets: 151 10*3/uL (ref 150–450)
RBC: 4.63 x10E6/uL (ref 4.14–5.80)
RDW: 13.5 % (ref 11.6–15.4)
WBC: 6.4 10*3/uL (ref 3.4–10.8)

## 2022-04-23 LAB — PT AND PTT
INR: 1 (ref 0.9–1.2)
Prothrombin Time: 11.1 s (ref 9.1–12.0)
aPTT: 31 s (ref 24–33)

## 2022-05-05 ENCOUNTER — Ambulatory Visit (HOSPITAL_COMMUNITY): Payer: Medicare Other | Admitting: Anesthesiology

## 2022-05-05 ENCOUNTER — Ambulatory Visit (HOSPITAL_COMMUNITY)
Admission: RE | Admit: 2022-05-05 | Discharge: 2022-05-05 | Disposition: A | Discharge status: Home / Self Care | Payer: Medicare Other | Source: Ambulatory Visit | Attending: Cardiology | Admitting: Cardiology

## 2022-05-05 ENCOUNTER — Encounter (HOSPITAL_COMMUNITY): Payer: Self-pay | Admitting: Cardiology

## 2022-05-05 ENCOUNTER — Other Ambulatory Visit: Payer: Self-pay

## 2022-05-05 ENCOUNTER — Ambulatory Visit (HOSPITAL_BASED_OUTPATIENT_CLINIC_OR_DEPARTMENT_OTHER): Payer: Medicare Other | Admitting: Anesthesiology

## 2022-05-05 ENCOUNTER — Ambulatory Visit (HOSPITAL_BASED_OUTPATIENT_CLINIC_OR_DEPARTMENT_OTHER)
Admission: RE | Admit: 2022-05-05 | Discharge: 2022-05-05 | Disposition: A | Discharge status: Home / Self Care | Payer: Medicare Other | Source: Ambulatory Visit | Attending: Cardiology | Admitting: Cardiology

## 2022-05-05 ENCOUNTER — Encounter (HOSPITAL_COMMUNITY)
Admission: RE | Disposition: A | Discharge status: Home / Self Care | Payer: Self-pay | Source: Ambulatory Visit | Attending: Cardiology

## 2022-05-05 DIAGNOSIS — I340 Nonrheumatic mitral (valve) insufficiency: Secondary | ICD-10-CM | POA: Diagnosis not present

## 2022-05-05 DIAGNOSIS — Z7982 Long term (current) use of aspirin: Secondary | ICD-10-CM | POA: Diagnosis not present

## 2022-05-05 DIAGNOSIS — N189 Chronic kidney disease, unspecified: Secondary | ICD-10-CM | POA: Diagnosis not present

## 2022-05-05 DIAGNOSIS — I700 Atherosclerosis of aorta: Secondary | ICD-10-CM | POA: Insufficient documentation

## 2022-05-05 DIAGNOSIS — I341 Nonrheumatic mitral (valve) prolapse: Secondary | ICD-10-CM | POA: Diagnosis not present

## 2022-05-05 DIAGNOSIS — Z87891 Personal history of nicotine dependence: Secondary | ICD-10-CM

## 2022-05-05 DIAGNOSIS — I10 Essential (primary) hypertension: Secondary | ICD-10-CM | POA: Insufficient documentation

## 2022-05-05 DIAGNOSIS — E785 Hyperlipidemia, unspecified: Secondary | ICD-10-CM | POA: Insufficient documentation

## 2022-05-05 DIAGNOSIS — I2510 Atherosclerotic heart disease of native coronary artery without angina pectoris: Secondary | ICD-10-CM | POA: Insufficient documentation

## 2022-05-05 DIAGNOSIS — I129 Hypertensive chronic kidney disease with stage 1 through stage 4 chronic kidney disease, or unspecified chronic kidney disease: Secondary | ICD-10-CM | POA: Diagnosis not present

## 2022-05-05 DIAGNOSIS — Z79899 Other long term (current) drug therapy: Secondary | ICD-10-CM | POA: Insufficient documentation

## 2022-05-05 HISTORY — PX: TEE WITHOUT CARDIOVERSION: SHX5443

## 2022-05-05 SURGERY — ECHOCARDIOGRAM, TRANSESOPHAGEAL
Anesthesia: Monitor Anesthesia Care

## 2022-05-05 MED ORDER — SODIUM CHLORIDE 0.9 % IV SOLN: INTRAVENOUS | Status: DC

## 2022-05-05 MED ORDER — PROPOFOL 500 MG/50ML IV EMUL
INTRAVENOUS | Status: DC | PRN
  Administered 2022-05-05: 125 ug/kg/min via INTRAVENOUS

## 2022-05-05 MED ORDER — LIDOCAINE VISCOUS HCL 2 % MT SOLN: 15.0000 mL | Freq: Once | OROMUCOSAL | Status: DC

## 2022-05-05 MED ORDER — LIDOCAINE VISCOUS HCL 2 % MT SOLN
OROMUCOSAL | Status: DC | PRN
  Administered 2022-05-05: 1 via OROMUCOSAL

## 2022-05-05 MED ORDER — LIDOCAINE VISCOUS HCL 2 % MT SOLN
OROMUCOSAL | Status: AC
  Filled 2022-05-05: qty 15

## 2022-05-05 NOTE — Interval H&P Note (Signed)
History and Physical Interval Note:  05/05/2022 1:02 PM  Benjamin Jensen  has presented today for surgery, with the diagnosis of [place].  The various methods of treatment have been discussed with the patient and family. After consideration of risks, benefits and other options for treatment, the patient has consented to  Procedure(s): TRANSESOPHAGEAL ECHOCARDIOGRAM (TEE) (N/A) as a surgical intervention.  The patient's history has been reviewed, patient examined, no change in status, stable for surgery.  I have reviewed the patient's chart and labs.  Questions were answered to the patient's satisfaction.     Trayvond Viets Harrell Gave

## 2022-05-05 NOTE — Discharge Instructions (Signed)

## 2022-05-05 NOTE — Progress Notes (Signed)
  Echocardiogram Echocardiogram Transesophageal has been performed.  Johny Chess 05/05/2022, 2:49 PM

## 2022-05-05 NOTE — CV Procedure (Signed)
    TRANSESOPHAGEAL ECHOCARDIOGRAM   NAME:  Benjamin Jensen   MRN: [id number] DOB:  Nov 12, 1935   ADMIT DATE: 05/05/2022  INDICATIONS: Mitral regurgitation  PROCEDURE:   Informed consent was obtained prior to the procedure. The risks, benefits and alternatives for the procedure were discussed and the patient comprehended these risks.  Risks include, but are not limited to, cough, sore throat, vomiting, nausea, somnolence, esophageal and stomach trauma or perforation, bleeding, low blood pressure, aspiration, pneumonia, infection, trauma to the teeth and death.    Procedural time out performed. The oropharynx was anesthetized with topical 1% lidocaine.    Patient received monitored anesthesia care under the supervision of Dr. Ermalene Postin. Patient received a total of 270 mg propofol during the procedure.  The transesophageal probe was inserted in the esophagus and stomach without difficulty and multiple views were obtained.    COMPLICATIONS:    There were no immediate complications.  FINDINGS:  LEFT VENTRICLE: EF = 60-65%. No regional wall motion abnormalities.  RIGHT VENTRICLE: Normal size and function.   LEFT ATRIUM: No thrombus/mass.  LEFT ATRIAL APPENDAGE: No thrombus/mass.   RIGHT ATRIUM: No thrombus/mass.  AORTIC VALVE:  Trileaflet. Trivial regurgitation. No vegetation.  MITRAL VALVE:    Abnormal structure, with prolapse and small flail segment of P2. Moderate regurgitation. No vegetation.  TRICUSPID VALVE: Normal structure. Trivial regurgitation. No vegetation.  PULMONIC VALVE: Grossly normal structure. Trivial regurgitation. No apparent vegetation.  INTERATRIAL SEPTUM: No PFO or ASD seen by color Doppler.  PERICARDIUM: No effusion noted.  DESCENDING AORTA: Moderate diffuse plaque seen   CONCLUSION: Very difficult TEE images, despite positioning and multiple angles/views. Overall MR appears moderate, which may be due to BP during case (102/51 at the end of the case).  Very eccentric jet with at least two jets, making PISA measurements difficult. No PV flow reversal noted, and left atrium is not severely dilated. Prolapse P2 with small flail segment.    Buford Dresser, MD, PhD [facility]  [address], [place], [place] [address] [phone]   2:30 PM

## 2022-05-05 NOTE — Transfer of Care (Signed)
Immediate Anesthesia Transfer of Care Note  Patient: Benjamin Jensen  Procedure(s) Performed: TRANSESOPHAGEAL ECHOCARDIOGRAM (TEE)  Patient Location: PACU  Anesthesia Type:MAC  Level of Consciousness: drowsy  Airway & Oxygen Therapy: Patient Spontanous Breathing and Patient connected to nasal cannula oxygen  Post-op Assessment: Report given to RN and Post -op Vital signs reviewed and stable  Post vital signs: Reviewed and stable  Last Vitals:  Vitals Value Taken Time  BP    Temp    Pulse    Resp    SpO2      Last Pain:  Vitals:   05/05/22 1258  TempSrc: Temporal  PainSc: 0-No pain         Complications: No notable events documented.

## 2022-05-05 NOTE — Anesthesia Preprocedure Evaluation (Signed)
Anesthesia Evaluation  Patient identified by MRN, date of birth, ID band Patient awake    Reviewed: Allergy & Precautions, NPO status , Patient's Chart, lab work & pertinent test results  History of Anesthesia Complications Negative for: history of anesthetic complications  Airway Mallampati: III  TM Distance: >3 FB Neck ROM: Full    Dental  (+) Teeth Intact, Dental Advisory Given,    Pulmonary neg shortness of breath, neg sleep apnea, neg COPD, neg recent URI, former smoker,    breath sounds clear to auscultation       Cardiovascular hypertension, Pt. on medications (-) angina(-) Past MI + Valvular Problems/Murmurs MR  Rhythm:Regular  1. Left ventricular ejection fraction, by estimation, is 70 to 75%. The  left ventricle has hyperdynamic function. The left ventricle has no  regional wall motion abnormalities. Left ventricular diastolic parameters  are consistent with Grade I diastolic  dysfunction (impaired relaxation).  2. Right ventricular systolic function is normal. The right ventricular  size is normal. There is normal pulmonary artery systolic pressure.  3. The mitral valve is normal in structure. Moderate to severe mitral  valve regurgitation. No evidence of mitral stenosis. There is moderate  holosystolic prolapse of the lateral scallop of the posterior leaflet of  the mitral valve.  4. The aortic valve is calcified. There is moderate calcification of the  aortic valve. There is mild thickening of the aortic valve. Aortic valve  regurgitation is trivial. Aortic valve sclerosis is present, with no  evidence of aortic valve stenosis.  5. Pulmonic valve regurgitation is moderate.  6. There is mild dilatation of the ascending aorta, measuring 42 mm.  7. The inferior vena cava is normal in size with greater than 50%  respiratory variability, suggesting right atrial pressure of 3 mmHg.    Neuro/Psych negative  neurological ROS  negative psych ROS   GI/Hepatic negative GI ROS, Neg liver ROS,   Endo/Other    Renal/GU CRFRenal diseaseLab Results      Component                Value               Date                      CREATININE               1.45 (H)            04/22/2022                Musculoskeletal negative musculoskeletal ROS (+)   Abdominal   Peds  Hematology negative hematology ROS (+) Lab Results      Component                Value               Date                      WBC                      6.4                 04/22/2022                HGB                      13.1  04/22/2022                HCT                      39.8                04/22/2022                MCV                      86                  04/22/2022                PLT                      151                 04/22/2022              Anesthesia Other Findings   Reproductive/Obstetrics                             Anesthesia Physical Anesthesia Plan  ASA: 3  Anesthesia Plan: MAC   Post-op Pain Management: Minimal or no pain anticipated   Induction: Intravenous  PONV Risk Score and Plan: 1 and Treatment may vary due to age or medical condition and Propofol infusion  Airway Management Planned: Nasal Cannula and Natural Airway  Additional Equipment: None  Intra-op Plan:   Post-operative Plan:   Informed Consent: I have reviewed the patients History and Physical, chart, labs and discussed the procedure including the risks, benefits and alternatives for the proposed anesthesia with the patient or authorized representative who has indicated his/her understanding and acceptance.     Dental advisory given  Plan Discussed with: CRNA  Anesthesia Plan Comments:         Anesthesia Quick Evaluation

## 2022-05-06 NOTE — Anesthesia Postprocedure Evaluation (Signed)
Anesthesia Post Note  Patient: Benjamin Jensen  Procedure(s) Performed: TRANSESOPHAGEAL ECHOCARDIOGRAM (TEE)     Patient location during evaluation: Endoscopy Anesthesia Type: MAC Level of consciousness: awake and alert Pain management: pain level controlled Vital Signs Assessment: post-procedure vital signs reviewed and stable Respiratory status: spontaneous breathing, nonlabored ventilation and respiratory function stable Cardiovascular status: stable and blood pressure returned to baseline Postop Assessment: no apparent nausea or vomiting Anesthetic complications: no   No notable events documented.  Last Vitals:  Vitals:   05/05/22 1450 05/05/22 1500  BP: 107/70 (!) 116/47  Pulse: 60 65  Resp: 20 (!) 27  Temp:    SpO2: 98% 96%    Last Pain:  Vitals:   05/05/22 1500  TempSrc:   PainSc: 0-No pain                 Solene Hereford

## 2022-05-08 ENCOUNTER — Encounter (HOSPITAL_COMMUNITY): Payer: Self-pay | Admitting: Cardiology

## 2022-05-09 ENCOUNTER — Other Ambulatory Visit: Payer: Self-pay

## 2022-05-09 DIAGNOSIS — R072 Precordial pain: Secondary | ICD-10-CM

## 2022-05-09 DIAGNOSIS — I10 Essential (primary) hypertension: Secondary | ICD-10-CM

## 2022-05-09 MED ORDER — FUROSEMIDE 20 MG PO TABS: ORAL_TABLET | ORAL | 3 refills | Status: DC

## 2022-05-17 ENCOUNTER — Other Ambulatory Visit: Payer: Self-pay

## 2022-05-17 ENCOUNTER — Telehealth: Payer: Self-pay | Admitting: Cardiology

## 2022-05-17 MED ORDER — COLCHICINE 0.6 MG PO TABS: 0.6000 mg | ORAL_TABLET | Freq: Every day | ORAL | 3 refills | Status: AC

## 2022-05-17 NOTE — Telephone Encounter (Signed)
Spoke to patient Dr.Jordan advised to stop taking Lasix if not helping.Advised to start Colchicine 0.6 mg daily for gout.Advised to keep appointment with Dr.Jordan 10/19 at 10:00 am.

## 2022-05-17 NOTE — Telephone Encounter (Signed)
Patient's spouse callted to talk with Dr. [place] or nurse. Please call back

## 2022-05-17 NOTE — Telephone Encounter (Signed)
Spoke to patient he stated he has gout in left foot.Stated left great toe swollen and very painful.He feels like Lasix has caused gout flare up.He wanted to ask Dr.Jordan to prescribe something for pain.Advised he should call PCP.Stated he cannot tell Lasix is helping sob.Advised Dr.Jordan is out of office.I will make him aware.

## 2022-05-22 NOTE — Progress Notes (Deleted)
Cardiology Office Note   Date:  05/22/2022   ID:  [name], DOB [birth year], MRN [id number]  PCP:  Lorene Dy, MD  Cardiologist:   Caeleb Batalla [place], MD   No chief complaint on file.   History of Present Illness: Benjamin Jensen is a 86 y.o. male who is seen for follow up CAD. He is a former patient of Dr. Wynonia Lawman. He had a prior abnormal stress test in 2015. This led to a cardiac cath showing only minimal wall irregularities and normal LV function. He has a history of HLD and HTN. He also has MVP with mild to moderate MR.   He had a coronary CTA last year showing no significant CAD.   More recently he had Echo to follow up on his MVP and MR. This showed worsening of his MR to moderate to severe range. When last seen he really denied any symptoms and we elected not to proceed with further evaluation  On follow up today he reports that he is now getting SOB easily and is more fatigued. Dr Mancel Bale held his lipitor and reduced his Maxide dose to see if this would help but he has noted no improvement. No chest pain or palpitations.   To further evaluate he underwent TEE on 05/05/22; this was a technically limited study. There was a partially flail P2 segment of the MV with likely moderate MR. We did place the patient on lasix to see if this would help but he developed a severe gout attack and stopped it. States he didn't notice any improvement in symptoms.    Past Medical History:  Diagnosis Date   Abnormal stress ECG with treadmill 03/17/2014   Angina pectoris ([place]) 03/17/2014   Essential hypertension 03/17/2014   Hyperlipidemia 03/17/2014   Kidney stones    Lumbar disc disease     Past Surgical History:  Procedure Laterality Date   CARPAL TUNNEL RELEASE     CATARACT EXTRACTION     KIDNEY STONE SURGERY     LEFT HEART CATHETERIZATION WITH CORONARY ANGIOGRAM N/A 03/17/2014   Procedure: LEFT HEART CATHETERIZATION WITH CORONARY ANGIOGRAM;  Surgeon: Jacolyn Reedy, MD;  Location:  [facility] CATH LAB;  Service: Cardiovascular;  Laterality: N/A;   LUMBAR LAMINECTOMY     TEE WITHOUT CARDIOVERSION N/A 05/05/2022   Procedure: TRANSESOPHAGEAL ECHOCARDIOGRAM (TEE);  Surgeon: Buford Dresser, MD;  Location: [facility] ENDOSCOPY;  Service: Cardiovascular;  Laterality: N/A;   TONSILLECTOMY       Current Outpatient Medications  Medication Sig Dispense Refill   alfuzosin (UROXATRAL) 10 MG 24 hr tablet Take 10 mg by mouth daily in the afternoon.     amLODipine (NORVASC) 5 MG tablet Take 5 mg by mouth in the morning.     aspirin EC 81 MG tablet Take 81 mg by mouth in the morning.     atorvastatin (LIPITOR) 10 MG tablet Take 10 mg by mouth in the morning.     colchicine 0.6 MG tablet Take 1 tablet (0.6 mg total) by mouth daily. 30 tablet 3   Glucosamine-Chondroitin (COSAMIN DS PO) Take 1 tablet by mouth in the morning and at bedtime.     Saw Palmetto, Serenoa repens, (SAW PALMETTO PO) Take 1 capsule by mouth daily in the afternoon.     tetrahydrozoline-zinc (VISINE-AC) 0.05-0.25 % ophthalmic solution 2 drops 3 (three) times daily as needed (allergy/irritated eyes.).     triamterene-hydrochlorothiazide (MAXZIDE-25) 37.5-25 MG tablet Take 1 tablet by mouth every Monday, Wednesday, and Friday.  No current facility-administered medications for this visit.    Allergies:   Patient has no known allergies.    Social History:  The patient  reports that he has quit smoking. He has never used smokeless tobacco. He reports that he does not drink alcohol and does not use drugs.   Family History:  The patient's family history includes Heart attack in his father; Heart failure in his mother.    ROS:  Please see the history of present illness.   Otherwise, review of systems are positive for none.   All other systems are reviewed and negative.    PHYSICAL EXAM: VS:  There were no vitals taken for this visit. , BMI There is no height or weight on file to calculate BMI.  GEN: Well nourished, well  developed, in no acute distress  HEENT: normal  Neck: no JVD, carotid bruits, or masses Cardiac: RRR; he has a grade 2/6 holosystolic murmur at the apex.  Respiratory:  clear to auscultation bilaterally, normal work of breathing GI: soft, nontender, nondistended, + BS MS: no deformity or atrophy  Skin: warm and dry, no rash Neuro:  Strength and sensation are intact Psych: euthymic mood, full affect   EKG:  EKG is ordered today. The ekg ordered today demonstrates NSR rate 57.  Otherwise normal.   I have personally reviewed and interpreted this study.   Recent Labs: 04/22/2022: BUN 29; Creatinine, Ser 1.45; Hemoglobin 13.1; Platelets 151; Potassium 4.2; Sodium 138    Lipid Panel No results found for: "CHOL", "TRIG", "HDL", "CHOLHDL", "VLDL", "LDLCALC", "LDLDIRECT"    Dated 2/27//20: creatinine 1.2.   Wt Readings from Last 3 Encounters:  05/05/22 183 lb 3.2 oz (83.1 kg)  04/22/22 183 lb 3.2 oz (83.1 kg)  01/19/22 180 lb 9.6 oz (81.9 kg)      Other studies Reviewed: Additional studies/ records that were reviewed today include:   Cardiac Catheterization Report    Benjamin Jensen    86 y.o.  male  DOB: 1936/03/21  MRN: [id number]  03/17/2014      PROCEDURE:  Left heart catheterization with selective coronary angiography, left ventriculogram.   INDICATIONS:  Abnormal treadmill the patient with multiple risk factors and angina pectoris with an abnormal exercise treadmill   The risks, benefits, and details of the procedure were explained to the patient.  The patient verbalized understanding and wanted to proceed.  Informed written consent was obtained.   PROCEDURE TECHNIQUE:  After Xylocaine anesthesia a 58F sheath was placed in the right femoral artery with a single anterior needle wall stick.   Left coronary angiography was done using a Judkins L4 guide catheter.  Right coronary angiography was done using a Judkins R4 guide catheter.  A 30 cc ventriculogram was performed in the  30 degree RAO projection.  Tolerated the procedure well.  Sheath removed in the holding area.   CONTRAST:  Total of 55 cc.   COMPLICATIONS:  None.     HEMODYNAMICS:  Aortic pressure was 136/66; left ventricle postcontrast 136/5-11.  There was no gradient between the left ventricle and aorta.     ANGIOGRAPHIC DATA:     CORONARY ARTERIES:   Arise and distribute normally.  Right dominant. No coronary calcification is noted.   Left main coronary artery: Normal    Left anterior descending: Scattered irregularities but no significant obstructive stenoses noted.   Circumflex coronary artery: Scattered irregularities with no significant obstructive stenosis is noted.   Right coronary artery: Scattered irregularities.  LEFT VENTRICULOGRAM:  Performed in the 30 RAO projection.  The aortic and mitral valves are normal. The left ventricle is normal in size with normal wall motion. Estimated ejection fraction is 60%.    IMPRESSIONS:   Scattered irregularities but no significant obstructive coronary artery disease noted Normal left ventricle.Marland Kitchen   RECOMMENDATION:  Continued medical therapy, good blood pressure control.Kerry Hough. MD [facility]  Echo 01/25/19: IMPRESSIONS     1. The left ventricle has normal systolic function, with an ejection  fraction of 55-60%. The cavity size was normal. There is mildly increased  left ventricular wall thickness. Left ventricular diastolic Doppler  parameters are consistent with impaired  relaxation.   2. The right ventricle has normal systolic function. The cavity was  normal.   3. Left atrial size was mildly dilated.   4. Moderate mitral valve prolapse.   5. The mitral valve is abnormal. Mild thickening of the mitral valve  leaflet. There is mild mitral annular calcification present. Mitral valve  regurgitation is mild to moderate by color flow Doppler.   6. The tricuspid valve is grossly normal.   7. The aortic valve is tricuspid.  Mild calcification of the aortic valve.  Aortic valve regurgitation is trivial by color flow Doppler. No stenosis  of the aortic valve.   8. There is mild dilatation of the ascending aorta measuring 42 mm.   9. Normal LV systolic function; mild diastolic dysfunction; mild LVH;  trace AI; prolapse of posterior MV leaflet with eccentric MR (appears mild  to moderate but may be underestimated due to eccentric nature of jet);  mild LAE.   Echo 01/07/22: IMPRESSIONS     1. Left ventricular ejection fraction, by estimation, is 70 to 75%. The  left ventricle has hyperdynamic function. The left ventricle has no  regional wall motion abnormalities. Left ventricular diastolic parameters  are consistent with Grade I diastolic  dysfunction (impaired relaxation).   2. Right ventricular systolic function is normal. The right ventricular  size is normal. There is normal pulmonary artery systolic pressure.   3. The mitral valve is normal in structure. Moderate to severe mitral  valve regurgitation. No evidence of mitral stenosis. There is moderate  holosystolic prolapse of the lateral scallop of the posterior leaflet of  the mitral valve.   4. The aortic valve is calcified. There is moderate calcification of the  aortic valve. There is mild thickening of the aortic valve. Aortic valve  regurgitation is trivial. Aortic valve sclerosis is present, with no  evidence of aortic valve stenosis.   5. Pulmonic valve regurgitation is moderate.   6. There is mild dilatation of the ascending aorta, measuring 42 mm.   7. The inferior vena cava is normal in size with greater than 50%  respiratory variability, suggesting right atrial pressure of 3 mmHg.  ADDENDUM REPORT: 12/17/2020 16:00   CLINICAL DATA:  45M with hypertension, hyperlipidemia, and coronary calcification.   EXAM: Cardiac/Coronary  CT   TECHNIQUE: The patient was scanned on a [employer].   FINDINGS: A 120 kV prospective scan was  triggered in the descending thoracic aorta at 111 HU's. Axial non-contrast 3 mm slices were carried out through the heart. The data set was analyzed on a dedicated work station and scored using the [place]. Gantry rotation speed was 250 msecs and collimation was .6 mm. No beta blockade and 0.8 mg of sl NTG was given. The 3D data set was reconstructed  in 5% intervals of the 67-82 % of the R-R cycle. Diastolic phases were analyzed on a dedicated work station using MPR, MIP and VRT modes. The patient received 80 cc of contrast.   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 Calcification of the aortic root and the descending aorta. No dissection.   Aortic Valve:  Trileaflet.  Mild calcification of the aortic valve.   Coronary Arteries:  Normal coronary origin.  Right dominance.   RCA is a large dominant artery that gives rise to PDA and PLVB. There is no plaque.   Left main is a large artery that gives rise to LAD and LCX arteries.   LAD is a large vessel that has minimal (<25%) soft plaque in the proximal to mid LAD. There are two diagonals without plaque.   LCX is a non-dominant artery that gives rise to one large OM1 branch. There is no plaque.   Coronary Calcium Score:   Left main: 0   Left anterior descending artery: 15.1   Left circumflex artery: 0   Right coronary artery: 0   Total: 15.1   Percentile: 4th   Other findings:   Normal pulmonary vein drainage into the left atrium.   Normal let atrial appendage without a thrombus.   Normal size of the pulmonary artery.   IMPRESSION: 1. Coronary calcium score of 15.1. This was 4th percentile for age-, race-, and sex-matched controls.   2. Normal coronary origin with right dominance.   3. No evidence of obstructive CAD.   Skeet Latch, MD     Electronically Signed   By: Skeet Latch   On: 12/17/2020 16:00      TEE 05/05/22: IMPRESSIONS     1. Left ventricular ejection fraction, by  estimation, is 60 to 65%. The  left ventricle has normal function.   2. Right ventricular systolic function is normal. The right ventricular  size is normal.   3. No left atrial/left atrial appendage thrombus was detected.   4. Prolapse and small flail segment of P2. Moderate regurgitation. The  mitral valve is abnormal. Moderate mitral valve regurgitation. No evidence  of mitral stenosis.   5. The aortic valve is normal in structure. Aortic valve regurgitation is  trivial. No aortic stenosis is present.   6. There is Moderate (Grade III) plaque involving the descending aorta.   Conclusion(s)/Recommendation(s): Very difficult TEE images, despite  positioning and multiple angles/views. Overall MR appears moderate, which  may be due to BP during case (102/51 at the end of the case). Very  eccentric jet with at least two jets, making  PISA measurements difficult. No PV flow reversal noted, and left atrium is  not severely dilated. Prolapse P2 with small flail segment. Would consider  cMRI for quantitation if clinically appropriate.   ASSESSMENT AND PLAN:  1.  CAD. Minimal Nonobstructive by cath in 2015 and by CTA in May 2022. Continue ASA, statin therapy  2. HLD. On lipitor. Labs followed by Dr Mancel Bale. We will request a copy. Recommend he resume lipitor since symptoms have not improved off it.   3. HTN. Control has improved. Continue current meds.  Sodium restriction.  Not a candidate for beta blocker due to bradycardia.   4. MVP with mild to moderate MR by Echo in 2020. Now moderate to severe with holosystolic prolapse of the posterior leaflet. I personally reviewed Echo. His MR is directed anteriorly making it difficult to assess severity. It is at least moderate. He is now more symptomatic  with SOB and fatigue.  No improvement with better BP control. I recommend doing a TEE at this point to better assess valve structure and degree of MR. If severe will need to consider MV repair versus  Mitraclip.    Current medicines are reviewed at length with the patient today.  The patient does not have concerns regarding medicines.   Disposition:   FU post TEE.   Signed, Braulio Kiedrowski [place], MD  05/22/2022 11:29 AM    [facility] [address], [place], [place], [address] Phone [phone], Fax [phone]

## 2022-05-24 LAB — BASIC METABOLIC PANEL
BUN/Creatinine Ratio: 19 (ref 10–24)
BUN: 22 mg/dL (ref 8–27)
CO2: 25 mmol/L (ref 20–29)
Calcium: 9.2 mg/dL (ref 8.6–10.2)
Chloride: 105 mmol/L (ref 96–106)
Creatinine, Ser: 1.17 mg/dL (ref 0.76–1.27)
Glucose: 113 mg/dL — ABNORMAL HIGH (ref 70–99)
Potassium: 3.8 mmol/L (ref 3.5–5.2)
Sodium: 141 mmol/L (ref 134–144)
eGFR: 62 mL/min/{1.73_m2} (ref >59)

## 2022-05-26 ENCOUNTER — Encounter: Payer: Self-pay | Admitting: Cardiology

## 2022-05-26 ENCOUNTER — Ambulatory Visit: Payer: Medicare Other | Admitting: Cardiology

## 2022-05-26 ENCOUNTER — Ambulatory Visit: Payer: Medicare Other | Attending: Cardiology | Admitting: Cardiology

## 2022-05-26 VITALS — BP 155/87 | HR 67 | Ht 70.0 in | Wt 186.6 lb

## 2022-05-26 DIAGNOSIS — I10 Essential (primary) hypertension: Secondary | ICD-10-CM

## 2022-05-26 DIAGNOSIS — I341 Nonrheumatic mitral (valve) prolapse: Secondary | ICD-10-CM

## 2022-05-26 DIAGNOSIS — R072 Precordial pain: Secondary | ICD-10-CM | POA: Diagnosis not present

## 2022-05-26 DIAGNOSIS — I340 Nonrheumatic mitral (valve) insufficiency: Secondary | ICD-10-CM

## 2022-05-26 NOTE — Progress Notes (Signed)
Cardiology Office Note   Date:  05/26/2022   ID:  Benjamin Jensen, DOB [birth year], MRN [id number]  PCP:  Lorene Dy, MD  Cardiologist:   Tania Steinhauser [place], MD   No chief complaint on file.   History of Present Illness: Benjamin Jensen is a 86 y.o. male who is seen for follow up CAD. He is a former patient of Dr. Wynonia Lawman. He had a prior abnormal stress test in 2015. This led to a cardiac cath showing only minimal wall irregularities and normal LV function. He has a history of HLD and HTN. He also has MVP with mild to moderate MR.   He had a coronary CTA last year showing no significant CAD.   More recently he had Echo to follow up on his MVP and MR. This showed worsening of his MR to moderate to severe range. When last seen he really denied any symptoms and we elected not to proceed with further evaluation  On follow up today he reports that he is now getting SOB easily and is more fatigued. Dr Mancel Bale held his lipitor and reduced his Maxide dose to see if this would help but he has noted no improvement. No chest pain or palpitations.   To further evaluate he underwent TEE on 05/05/22; this was a technically limited study. There was a partially flail P2 segment of the MV with likely moderate MR. We did place the patient on lasix to see if this would help but he developed a severe gout attack and stopped it. States he didn't notice any improvement in symptoms with the lasix. He states breathing is doing OK now. No edema. Sometimes feels a "hollow" feeling in his chest. No dizziness. Has obstructive urinary symptoms. Followed by urology. Notes BP consistently low at home.    Past Medical History:  Diagnosis Date   Abnormal stress ECG with treadmill 03/17/2014   Angina pectoris ([place]) 03/17/2014   Essential hypertension 03/17/2014   Hyperlipidemia 03/17/2014   Kidney stones    Lumbar disc disease     Past Surgical History:  Procedure Laterality Date   CARPAL TUNNEL RELEASE     CATARACT  EXTRACTION     KIDNEY STONE SURGERY     LEFT HEART CATHETERIZATION WITH CORONARY ANGIOGRAM N/A 03/17/2014   Procedure: LEFT HEART CATHETERIZATION WITH CORONARY ANGIOGRAM;  Surgeon: Jacolyn Reedy, MD;  Location[facility] CATH LAB;  Service: Cardiovascular;  Laterality: N/A;   LUMBAR LAMINECTOMY     TEE WITHOUT CARDIOVERSION N/A 05/05/2022   Procedure: TRANSESOPHAGEAL ECHOCARDIOGRAM (TEE);  Surgeon: Buford Dresser, MD;  Location: [facility] ENDOSCOPY;  Service: Cardiovascular;  Laterality: N/A;   TONSILLECTOMY       Current Outpatient Medications  Medication Sig Dispense Refill   alfuzosin (UROXATRAL) 10 MG 24 hr tablet Take 10 mg by mouth daily in the afternoon.     amLODipine (NORVASC) 5 MG tablet Take 5 mg by mouth in the morning.     atorvastatin (LIPITOR) 10 MG tablet Take 10 mg by mouth in the morning.     colchicine 0.6 MG tablet Take 1 tablet (0.6 mg total) by mouth daily. 30 tablet 3   Glucosamine-Chondroitin (COSAMIN DS PO) Take 1 tablet by mouth in the morning and at bedtime.     Saw Palmetto, Serenoa repens, (SAW PALMETTO PO) Take 1 capsule by mouth daily in the afternoon.     tetrahydrozoline-zinc (VISINE-AC) 0.05-0.25 % ophthalmic solution 2 drops 3 (three) times daily as needed (allergy/irritated eyes.).  triamterene-hydrochlorothiazide (MAXZIDE-25) 37.5-25 MG tablet Take 1 tablet by mouth every Monday, Wednesday, and Friday.     aspirin EC 81 MG tablet Take 81 mg by mouth in the morning. (Patient not taking: Reported on 05/26/2022)     No current facility-administered medications for this visit.    Allergies:   Patient has no known allergies.    Social History:  The patient  reports that he has quit smoking. He has never used smokeless tobacco. He reports that he does not drink alcohol and does not use drugs.   Family History:  The patient's family history includes Heart attack in his father; Heart failure in his mother.    ROS:  Please see the history of present  illness.   Otherwise, review of systems are positive for none.   All other systems are reviewed and negative.    PHYSICAL EXAM: VS:  BP (!) 155/87 (BP Location: Left Arm, Patient Position: Sitting, Cuff Size: Normal)   Pulse 67   Ht 5\' 10"  ([id number] m)   Wt 186 lb 9.6 oz (84.6 kg)   SpO2 97%   BMI 26.77 kg/m  , BMI Body mass index is 26.77 kg/m.  GEN: Well nourished, well developed, in no acute distress  HEENT: normal  Neck: no JVD, carotid bruits, or masses Cardiac: RRR; he has a grade 2/6 holosystolic murmur at the apex.  Respiratory:  clear to auscultation bilaterally, normal work of breathing GI: soft, nontender, nondistended, + BS MS: no deformity or atrophy  Skin: warm and dry, no rash Neuro:  Strength and sensation are intact Psych: euthymic mood, full affect   EKG:  EKG is not ordered today.    Recent Labs: 04/22/2022: Hemoglobin 13.1; Platelets 151 05/23/2022: BUN 22; Creatinine, Ser 1.17; Potassium 3.8; Sodium 141    Lipid Panel No results found for: "CHOL", "TRIG", "HDL", "CHOLHDL", "VLDL", "LDLCALC", "LDLDIRECT"    Dated 2/27//20: creatinine 1.2.   Wt Readings from Last 3 Encounters:  05/26/22 186 lb 9.6 oz (84.6 kg)  05/05/22 183 lb 3.2 oz (83.1 kg)  04/22/22 183 lb 3.2 oz (83.1 kg)      Other studies Reviewed: Additional studies/ records that were reviewed today include:   Cardiac Catheterization Report    Benjamin Jensen    87 y.o.  male  DOB: 03/15/1936  MRN: [id number]  03/17/2014      PROCEDURE:  Left heart catheterization with selective coronary angiography, left ventriculogram.   INDICATIONS:  Abnormal treadmill the patient with multiple risk factors and angina pectoris with an abnormal exercise treadmill   The risks, benefits, and details of the procedure were explained to the patient.  The patient verbalized understanding and wanted to proceed.  Informed written consent was obtained.   PROCEDURE TECHNIQUE:  After Xylocaine anesthesia a 59F  sheath was placed in the right femoral artery with a single anterior needle wall stick.   Left coronary angiography was done using a Judkins L4 guide catheter.  Right coronary angiography was done using a Judkins R4 guide catheter.  A 30 cc ventriculogram was performed in the 30 degree RAO projection.  Tolerated the procedure well.  Sheath removed in the holding area.   CONTRAST:  Total of 55 cc.   COMPLICATIONS:  None.     HEMODYNAMICS:  Aortic pressure was 136/66; left ventricle postcontrast 136/5-11.  There was no gradient between the left ventricle and aorta.     ANGIOGRAPHIC DATA:     CORONARY ARTERIES:   Arise and  distribute normally.  Right dominant. No coronary calcification is noted.   Left main coronary artery: Normal    Left anterior descending: Scattered irregularities but no significant obstructive stenoses noted.   Circumflex coronary artery: Scattered irregularities with no significant obstructive stenosis is noted.   Right coronary artery: Scattered irregularities.   LEFT VENTRICULOGRAM:  Performed in the 30 RAO projection.  The aortic and mitral valves are normal. The left ventricle is normal in size with normal wall motion. Estimated ejection fraction is 60%.    IMPRESSIONS:   Scattered irregularities but no significant obstructive coronary artery disease noted Normal left ventricle.Marland Kitchen   RECOMMENDATION:  Continued medical therapy, good blood pressure control.Kerry Hough. MD [facility]  Echo 01/25/19: IMPRESSIONS     1. The left ventricle has normal systolic function, with an ejection  fraction of 55-60%. The cavity size was normal. There is mildly increased  left ventricular wall thickness. Left ventricular diastolic Doppler  parameters are consistent with impaired  relaxation.   2. The right ventricle has normal systolic function. The cavity was  normal.   3. Left atrial size was mildly dilated.   4. Moderate mitral valve prolapse.   5. The  mitral valve is abnormal. Mild thickening of the mitral valve  leaflet. There is mild mitral annular calcification present. Mitral valve  regurgitation is mild to moderate by color flow Doppler.   6. The tricuspid valve is grossly normal.   7. The aortic valve is tricuspid. Mild calcification of the aortic valve.  Aortic valve regurgitation is trivial by color flow Doppler. No stenosis  of the aortic valve.   8. There is mild dilatation of the ascending aorta measuring 42 mm.   9. Normal LV systolic function; mild diastolic dysfunction; mild LVH;  trace AI; prolapse of posterior MV leaflet with eccentric MR (appears mild  to moderate but may be underestimated due to eccentric nature of jet);  mild LAE.   Echo 01/07/22: IMPRESSIONS     1. Left ventricular ejection fraction, by estimation, is 70 to 75%. The  left ventricle has hyperdynamic function. The left ventricle has no  regional wall motion abnormalities. Left ventricular diastolic parameters  are consistent with Grade I diastolic  dysfunction (impaired relaxation).   2. Right ventricular systolic function is normal. The right ventricular  size is normal. There is normal pulmonary artery systolic pressure.   3. The mitral valve is normal in structure. Moderate to severe mitral  valve regurgitation. No evidence of mitral stenosis. There is moderate  holosystolic prolapse of the lateral scallop of the posterior leaflet of  the mitral valve.   4. The aortic valve is calcified. There is moderate calcification of the  aortic valve. There is mild thickening of the aortic valve. Aortic valve  regurgitation is trivial. Aortic valve sclerosis is present, with no  evidence of aortic valve stenosis.   5. Pulmonic valve regurgitation is moderate.   6. There is mild dilatation of the ascending aorta, measuring 42 mm.   7. The inferior vena cava is normal in size with greater than 50%  respiratory variability, suggesting right atrial pressure  of 3 mmHg.  ADDENDUM REPORT: 12/17/2020 16:00   CLINICAL DATA:  42M with hypertension, hyperlipidemia, and coronary calcification.   EXAM: Cardiac/Coronary  CT   TECHNIQUE: The patient was scanned on a [employer].   FINDINGS: A 120 kV prospective scan was triggered in the descending thoracic aorta at 111 HU's. Axial  non-contrast 3 mm slices were carried out through the heart. The data set was analyzed on a dedicated work station and scored using the [place]. Gantry rotation speed was 250 msecs and collimation was .6 mm. No beta blockade and 0.8 mg of sl NTG was given. The 3D data set was reconstructed in 5% intervals of the 67-82 % of the R-R cycle. Diastolic phases were analyzed on a dedicated work station using MPR, MIP and VRT modes. The patient received 80 cc of contrast.   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 Calcification of the aortic root and the descending aorta. No dissection.   Aortic Valve:  Trileaflet.  Mild calcification of the aortic valve.   Coronary Arteries:  Normal coronary origin.  Right dominance.   RCA is a large dominant artery that gives rise to PDA and PLVB. There is no plaque.   Left main is a large artery that gives rise to LAD and LCX arteries.   LAD is a large vessel that has minimal (<25%) soft plaque in the proximal to mid LAD. There are two diagonals without plaque.   LCX is a non-dominant artery that gives rise to one large OM1 branch. There is no plaque.   Coronary Calcium Score:   Left main: 0   Left anterior descending artery: 15.1   Left circumflex artery: 0   Right coronary artery: 0   Total: 15.1   Percentile: 4th   Other findings:   Normal pulmonary vein drainage into the left atrium.   Normal let atrial appendage without a thrombus.   Normal size of the pulmonary artery.   IMPRESSION: 1. Coronary calcium score of 15.1. This was 4th percentile for age-, race-, and sex-matched  controls.   2. Normal coronary origin with right dominance.   3. No evidence of obstructive CAD.   Skeet Latch, MD     Electronically Signed   By: Skeet Latch   On: 12/17/2020 16:00      TEE 05/05/22: IMPRESSIONS     1. Left ventricular ejection fraction, by estimation, is 60 to 65%. The  left ventricle has normal function.   2. Right ventricular systolic function is normal. The right ventricular  size is normal.   3. No left atrial/left atrial appendage thrombus was detected.   4. Prolapse and small flail segment of P2. Moderate regurgitation. The  mitral valve is abnormal. Moderate mitral valve regurgitation. No evidence  of mitral stenosis.   5. The aortic valve is normal in structure. Aortic valve regurgitation is  trivial. No aortic stenosis is present.   6. There is Moderate (Grade III) plaque involving the descending aorta.   Conclusion(s)/Recommendation(s): Very difficult TEE images, despite  positioning and multiple angles/views. Overall MR appears moderate, which  may be due to BP during case (102/51 at the end of the case). Very  eccentric jet with at least two jets, making  PISA measurements difficult. No PV flow reversal noted, and left atrium is  not severely dilated. Prolapse P2 with small flail segment. Would consider  cMRI for quantitation if clinically appropriate.   ASSESSMENT AND PLAN:  1.  CAD. Minimal Nonobstructive by cath in 2015 and by CTA in May 2022. Continue ASA, statin therapy  2. HLD. On lipitor. Labs followed by Dr Mancel Bale. On lipitor 10 mg daily.   3. HTN. Patient reports good control on home BP monitor.  Continue current meds.  Sodium restriction.  Not a candidate for beta blocker due to bradycardia.  4. MVP TTE suggested  moderate to severe regurgitation with holosystolic prolapse of the posterior leaflet. His MR is directed anteriorly making it difficult to assess severity. It is at least moderate. He did have some symptoms of  SOB and fatigue.  No improvement with better BP control. TEE was somewhat limited but mechanism appears to be partially flail P2 component of the valve. Likely moderate MR. No improvement in symptoms with lasix. At this point will continue observation. I dont' think his MR is bad enough to warrant repair or Mitraclip.    Current medicines are reviewed at length with the patient today.  The patient does not have concerns regarding medicines.   Disposition:   FU 6 months  Signed, Donivin Wirt [place], MD  05/26/2022 11:59 AM    [place] [address] [place], [place], [address] Phone [phone], Fax [phone]

## 2023-02-19 IMAGING — CT CT HEART MORP W/ CTA COR W/ SCORE W/ CA W/CM &/OR W/O CM
4 of 7 series · 8 of 20 positions shown, 9 images · non-contrast
Comparison: None.
COMPARISON: None.

Addendum:
EXAM:
OVER-READ INTERPRETATION  CT CHEST

The following report is an over-read performed by radiologist Dr.
Ferienhaus Erxleben [REDACTED] on 12/17/2020. This
over-read does not include interpretation of cardiac or coronary
anatomy or pathology. The coronary CTA interpretation by the
cardiologist is attached.
CLINICAL DATA: 84M with hypertension, hyperlipidemia, and coronary
calcification.
Cardiac/Coronary  CT
TECHNIQUE: The patient was scanned on a [employer] scanner.

[Series 6: best diast 74 % · axial · 0.39mm/px · z∈[+1128,+1172]mm · 2 of 328 slices shown]
[im 110/328  vessel]
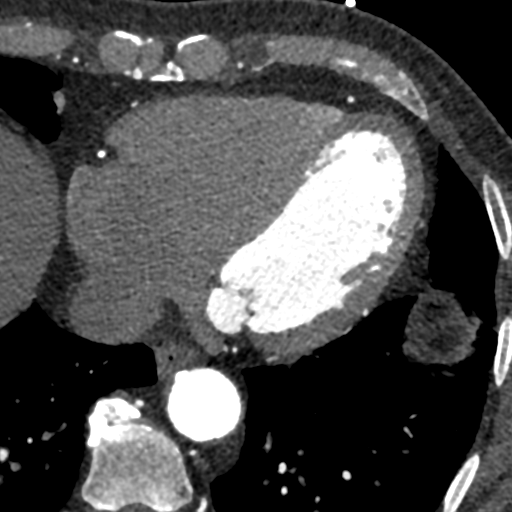
[im 219/328  vessel]
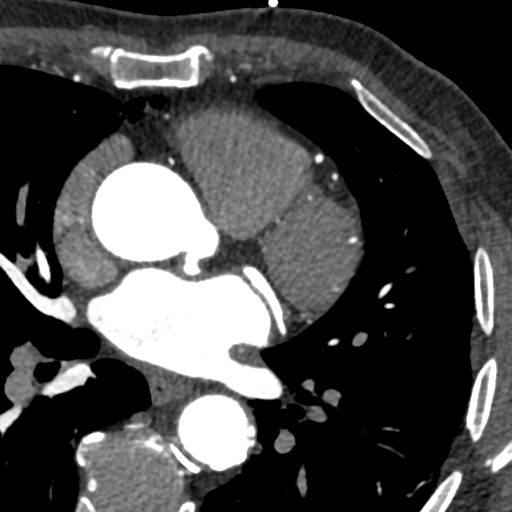

[Series 10: ts diast sharp 74 % · axial · 0.39mm/px · z∈[+1128,+1172]mm · 2 of 328 slices shown]
[im 110/328  lung]
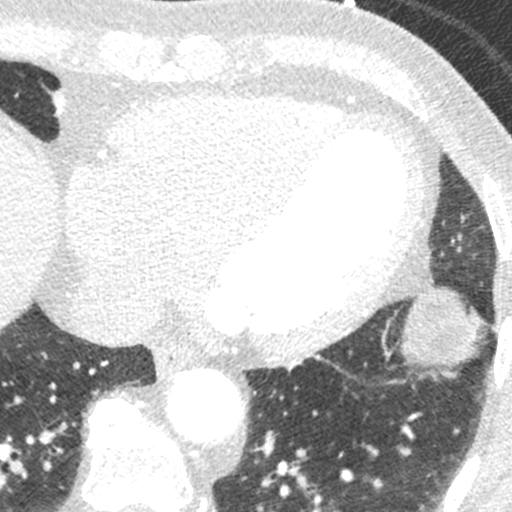
[im 219/328  lung]
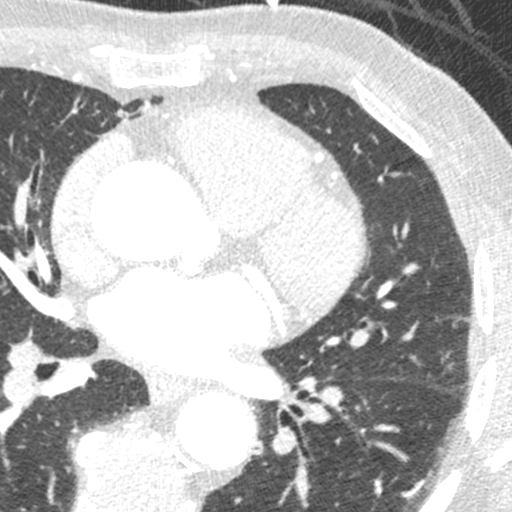

[Series 11: best syst · axial · 0.39mm/px · z∈[+1128,+1172]mm · 2 of 328 slices shown, 3 images]
[im 110/328  vessel]
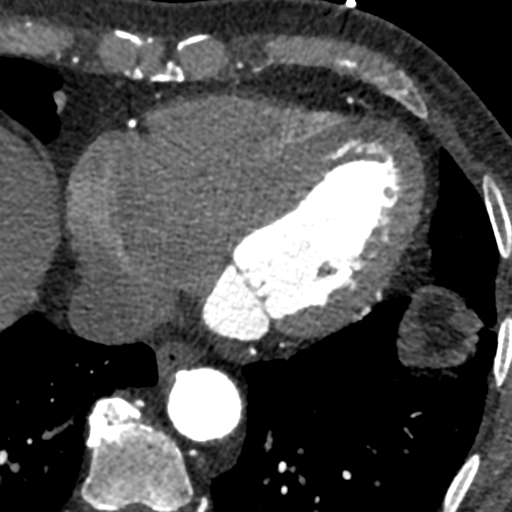
[im 110/328  lung]
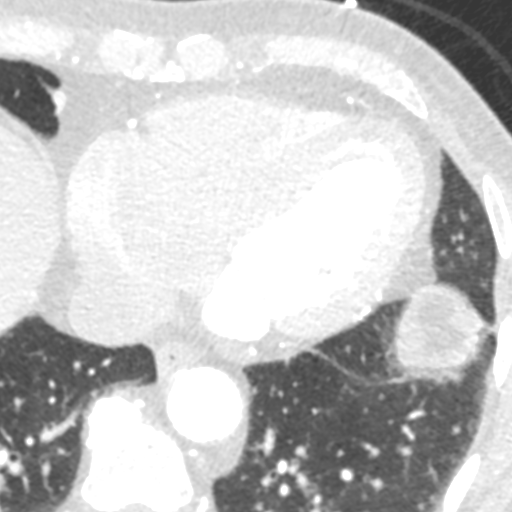
[im 219/328  vessel]
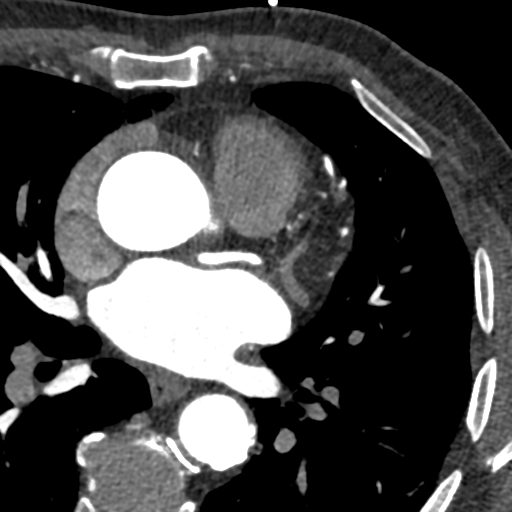

[Series 12: ts syst sharp · axial · 0.39mm/px · z∈[+1128,+1172]mm · 2 of 328 slices shown]
[im 110/328  lung]
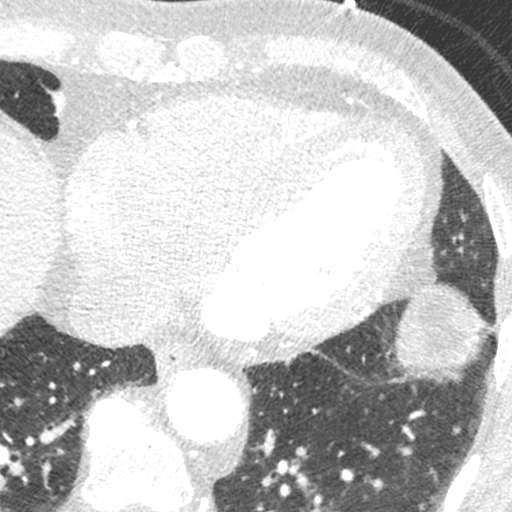
[im 219/328  lung]
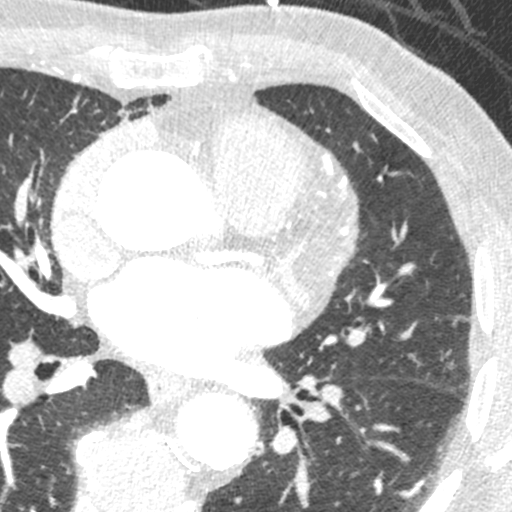

[8 of 20 positions shown; findings below may reference images not displayed]

FINDINGS: Vascular: The ascending thoracic aorta is mildly dilated and
measures approximately 4.1 cm in estimated maximal diameter. The
visualized descending thoracic aorta demonstrates calcified plaque
and mild dilatation with maximal diameter of 3.1 cm.

Mediastinum/Nodes: Visualized mediastinum and hilar regions
demonstrate no lymphadenopathy or masses.

Lungs/Pleura: Visualized lungs show no evidence of pulmonary edema,
consolidation, pneumothorax, nodule or pleural fluid.

Upper Abdomen: No acute abnormality.

Musculoskeletal: No chest wall mass or suspicious bone lesions
identified.
IMPRESSION: Mild dilatation of the ascending thoracic aorta measuring up to
approximately 4.1 cm. Atherosclerosis and mild dilatation of the
descending thoracic aorta measuring up to approximately 3.1 cm.
Recommend annual imaging followup by CTA or MRA. This recommendation
follows 0232 ACCF/AHA/AATS/ACR/ASA/SCA/WITMER/MAFULUL/DALENA/MORLA Guidelines
for the Diagnosis and Management of Patients with Thoracic Aortic
Disease. Circulation. 0232; 121: [id number]. Aortic aneurysm NOS
(8JQSJ-WIM.P)
FINDINGS: A 120 kV prospective scan was triggered in the descending thoracic
aorta at 111 HU's. Axial non-contrast 3 mm slices were carried out
through the heart. The data set was analyzed on a dedicated work
station and scored using the [place] method. Gantry rotation speed
was 250 msecs and collimation was .6 mm. No beta blockade and 0.8 mg
of sl NTG was given. The 3D data set was reconstructed in 5%
intervals of the 67-82 % of the R-R cycle. Diastolic phases were
analyzed on a dedicated work station using MPR, MIP and VRT modes.
The patient received 80 cc of contrast.

Aorta: Mild ascending aorta aneurysm. 4.2 cm. Calcification of the
aortic root and the descending aorta. No dissection.

Aortic Valve:  Trileaflet.  Mild calcification of the aortic valve.

Coronary Arteries:  Normal coronary origin.  Right dominance.

RCA is a large dominant artery that gives rise to PDA and PLVB.
There is no plaque.

Left main is a large artery that gives rise to LAD and LCX arteries.

LAD is a large vessel that has minimal (<25%) soft plaque in the
proximal to mid LAD. There are two diagonals without plaque.

LCX is a non-dominant artery that gives rise to one large OM1
branch. There is no plaque.

Coronary Calcium Score:

Left main: 0

Left anterior descending artery:

Left circumflex artery: 0

Right coronary artery: 0

Total:

Percentile: 4th

Other findings:

Normal pulmonary vein drainage into the left atrium.

Normal let atrial appendage without a thrombus.

Normal size of the pulmonary artery.
IMPRESSION: 1. Coronary calcium score of 15.1. This was 4th percentile for age-,
race-, and sex-matched controls.

2. Normal coronary origin with right dominance.

3. No evidence of obstructive CAD.

*** End of Addendum ***
EXAM:
OVER-READ INTERPRETATION  CT CHEST

The following report is an over-read performed by radiologist Dr.
Ferienhaus Erxleben [REDACTED] on 12/17/2020. This
over-read does not include interpretation of cardiac or coronary
anatomy or pathology. The coronary CTA interpretation by the
cardiologist is attached.
FINDINGS: Vascular: The ascending thoracic aorta is mildly dilated and
measures approximately 4.1 cm in estimated maximal diameter. The
visualized descending thoracic aorta demonstrates calcified plaque
and mild dilatation with maximal diameter of 3.1 cm.

Mediastinum/Nodes: Visualized mediastinum and hilar regions
demonstrate no lymphadenopathy or masses.

Lungs/Pleura: Visualized lungs show no evidence of pulmonary edema,
consolidation, pneumothorax, nodule or pleural fluid.

Upper Abdomen: No acute abnormality.

Musculoskeletal: No chest wall mass or suspicious bone lesions
identified.
IMPRESSION: Mild dilatation of the ascending thoracic aorta measuring up to
approximately 4.1 cm. Atherosclerosis and mild dilatation of the
descending thoracic aorta measuring up to approximately 3.1 cm.
Recommend annual imaging followup by CTA or MRA. This recommendation
follows 0232 ACCF/AHA/AATS/ACR/ASA/SCA/WITMER/MAFULUL/DALENA/MORLA Guidelines
for the Diagnosis and Management of Patients with Thoracic Aortic
Disease. Circulation. 0232; 121: [id number]. Aortic aneurysm NOS
(8JQSJ-WIM.P)

## 2023-05-19 NOTE — Progress Notes (Signed)
does not drink alcohol and does not use drugs.   Family History:  The patient's family history includes Heart attack in his father; Heart failure in his mother.    ROS:  Please see the history of present illness.   Otherwise, review of systems are positive for none.   All other systems are reviewed and negative.    PHYSICAL EXAM: VS:  BP 124/78   Pulse 70   Ht 5\' 10"  (1.778 m)   Wt 178 lb (80.7 kg)   SpO2 96%   BMI 25.54 kg/m  , BMI Body mass index is 25.54 kg/m.  GEN: Well nourished, well developed, in no acute distress  HEENT: normal  Neck: no JVD, carotid bruits, or masses Cardiac: RRR; he has a grade 2/6 holosystolic murmur at the apex.  Respiratory:  clear to auscultation bilaterally, normal work of breathing GI:  soft, nontender, nondistended, + BS MS: no deformity or atrophy  Skin: warm and dry, no rash Neuro:  Strength and sensation are intact Psych: euthymic mood, full affect  EKG Interpretation Date/Time:  Wednesday May 24 2023 13:33:13 EDT Ventricular Rate:  70 PR Interval:  184 QRS Duration:  100 QT Interval:  442 QTC Calculation: 477 R Axis:   3  Text Interpretation: Sinus rhythm with sinus arrhythmia with occasional Premature ventricular complexes Nonspecific T wave abnormality Prolonged QT When compared with ECG of 04-Mar-2006 08:57, Premature ventricular complexes are now Present Nonspecific T wave abnormality now evident in Lateral leads QT has lengthened PVCs are new Confirmed by [place], Matti Killingsworth [id number]) on 05/24/2023 1:40:51 PM      Recent Labs: No results found for requested labs within last 365 days.    Lipid Panel No results found for: "CHOL", "TRIG", "HDL", "CHOLHDL", "VLDL", "LDLCALC", "LDLDIRECT"    Dated 2/27//20: creatinine 1.2.   Wt Readings from Last 3 Encounters:  05/24/23 178 lb (80.7 kg)  05/26/22 186 lb 9.6 oz (84.6 kg)  05/05/22 183 lb 3.2 oz (83.1 kg)      Other studies Reviewed: Additional studies/ records that were reviewed today include:   Cardiac Catheterization Report    Benjamin Jensen    87 y.o.  male  DOB: 12/08/1935  MRN: [id number]  03/17/2014      PROCEDURE:  Left heart catheterization with selective coronary angiography, left ventriculogram.   INDICATIONS:  Abnormal treadmill the patient with multiple risk factors and angina pectoris with an abnormal exercise treadmill   The risks, benefits, and details of the procedure were explained to the patient.  The patient verbalized understanding and wanted to proceed.  Informed written consent was obtained.   PROCEDURE TECHNIQUE:  After Xylocaine anesthesia a 35F sheath was placed in the right femoral artery with a single anterior needle wall stick.   Left coronary angiography was done using a  Judkins L4 guide catheter.  Right coronary angiography was done using a Judkins R4 guide catheter.  A 30 cc ventriculogram was performed in the 30 degree RAO projection.  Tolerated the procedure well.  Sheath removed in the holding area.   CONTRAST:  Total of 55 cc.   COMPLICATIONS:  None.     HEMODYNAMICS:  Aortic pressure was 136/66; left ventricle postcontrast 136/5-11.  There was no gradient between the left ventricle and aorta.     ANGIOGRAPHIC DATA:     CORONARY ARTERIES:   Arise and distribute normally.  Right dominant. No coronary calcification is noted.   Left main coronary artery: Normal    Left  does not drink alcohol and does not use drugs.   Family History:  The patient's family history includes Heart attack in his father; Heart failure in his mother.    ROS:  Please see the history of present illness.   Otherwise, review of systems are positive for none.   All other systems are reviewed and negative.    PHYSICAL EXAM: VS:  BP 124/78   Pulse 70   Ht 5\' 10"  (1.778 m)   Wt 178 lb (80.7 kg)   SpO2 96%   BMI 25.54 kg/m  , BMI Body mass index is 25.54 kg/m.  GEN: Well nourished, well developed, in no acute distress  HEENT: normal  Neck: no JVD, carotid bruits, or masses Cardiac: RRR; he has a grade 2/6 holosystolic murmur at the apex.  Respiratory:  clear to auscultation bilaterally, normal work of breathing GI:  soft, nontender, nondistended, + BS MS: no deformity or atrophy  Skin: warm and dry, no rash Neuro:  Strength and sensation are intact Psych: euthymic mood, full affect  EKG Interpretation Date/Time:  Wednesday May 24 2023 13:33:13 EDT Ventricular Rate:  70 PR Interval:  184 QRS Duration:  100 QT Interval:  442 QTC Calculation: 477 R Axis:   3  Text Interpretation: Sinus rhythm with sinus arrhythmia with occasional Premature ventricular complexes Nonspecific T wave abnormality Prolonged QT When compared with ECG of 04-Mar-2006 08:57, Premature ventricular complexes are now Present Nonspecific T wave abnormality now evident in Lateral leads QT has lengthened PVCs are new Confirmed by [place], Matti Killingsworth [id number]) on 05/24/2023 1:40:51 PM      Recent Labs: No results found for requested labs within last 365 days.    Lipid Panel No results found for: "CHOL", "TRIG", "HDL", "CHOLHDL", "VLDL", "LDLCALC", "LDLDIRECT"    Dated 2/27//20: creatinine 1.2.   Wt Readings from Last 3 Encounters:  05/24/23 178 lb (80.7 kg)  05/26/22 186 lb 9.6 oz (84.6 kg)  05/05/22 183 lb 3.2 oz (83.1 kg)      Other studies Reviewed: Additional studies/ records that were reviewed today include:   Cardiac Catheterization Report    Benjamin Jensen    87 y.o.  male  DOB: 12/08/1935  MRN: [id number]  03/17/2014      PROCEDURE:  Left heart catheterization with selective coronary angiography, left ventriculogram.   INDICATIONS:  Abnormal treadmill the patient with multiple risk factors and angina pectoris with an abnormal exercise treadmill   The risks, benefits, and details of the procedure were explained to the patient.  The patient verbalized understanding and wanted to proceed.  Informed written consent was obtained.   PROCEDURE TECHNIQUE:  After Xylocaine anesthesia a 35F sheath was placed in the right femoral artery with a single anterior needle wall stick.   Left coronary angiography was done using a  Judkins L4 guide catheter.  Right coronary angiography was done using a Judkins R4 guide catheter.  A 30 cc ventriculogram was performed in the 30 degree RAO projection.  Tolerated the procedure well.  Sheath removed in the holding area.   CONTRAST:  Total of 55 cc.   COMPLICATIONS:  None.     HEMODYNAMICS:  Aortic pressure was 136/66; left ventricle postcontrast 136/5-11.  There was no gradient between the left ventricle and aorta.     ANGIOGRAPHIC DATA:     CORONARY ARTERIES:   Arise and distribute normally.  Right dominant. No coronary calcification is noted.   Left main coronary artery: Normal    Left  Cardiology Office Note   Date:  05/24/2023   ID:  Benjamin Jensen, DOB 29-Aug-1935, MRN [id number]  PCP:  Burton Apley, MD  Cardiologist:   Dariana Garbett [place], MD   Chief Complaint  Patient presents with   Mitral Valve Prolapse    History of Present Illness: Benjamin Jensen is a 87 y.o. male who is seen for follow up CAD. He had a prior abnormal stress test in 2015. This led to a cardiac cath showing only minimal wall irregularities and normal LV function. He has a history of HLD and HTN. He also has MVP with mild to moderate MR.   He had a coronary CTA in May 2022 showing no significant CAD.   He had Echo in June 2023 to follow up on his MVP and MR. This showed worsening of his MR to moderate to severe range. To further evaluate he underwent TEE on 05/05/22; this was a technically limited study. There was a partially flail P2 segment of the MV with likely moderate MR. We did place the patient on lasix to see if this would help but he developed a severe gout attack and stopped it. States he didn't notice any improvement in symptoms with the lasix.   On follow up today he is doing OK. Still push mows and walks his dog daily. Does note SOB with exertion. No edema. No chest pain. No fluttering. Has occasional gout still.    Past Medical History:  Diagnosis Date   Abnormal stress ECG with treadmill 03/17/2014   Angina pectoris (HCC) 03/17/2014   Essential hypertension 03/17/2014   Hyperlipidemia 03/17/2014   Kidney stones    Lumbar disc disease     Past Surgical History:  Procedure Laterality Date   CARPAL TUNNEL RELEASE     CATARACT EXTRACTION     KIDNEY STONE SURGERY     LEFT HEART CATHETERIZATION WITH CORONARY ANGIOGRAM N/A 03/17/2014   Procedure: LEFT HEART CATHETERIZATION WITH CORONARY ANGIOGRAM;  Surgeon: Othella Boyer, MD;  Location: [facility] CATH LAB;  Service: Cardiovascular;  Laterality: N/A;   LUMBAR LAMINECTOMY     TEE WITHOUT CARDIOVERSION N/A 05/05/2022   Procedure:  TRANSESOPHAGEAL ECHOCARDIOGRAM (TEE);  Surgeon: Jodelle Red, MD;  Location: [facility] ENDOSCOPY;  Service: Cardiovascular;  Laterality: N/A;   TONSILLECTOMY       Current Outpatient Medications  Medication Sig Dispense Refill   alfuzosin (UROXATRAL) 10 MG 24 hr tablet Take 10 mg by mouth daily in the afternoon.     amLODipine (NORVASC) 5 MG tablet Take 5 mg by mouth in the morning.     aspirin EC 81 MG tablet Take 81 mg by mouth in the morning.     atorvastatin (LIPITOR) 10 MG tablet Take 10 mg by mouth in the morning.     colchicine 0.6 MG tablet Take 1 tablet (0.6 mg total) by mouth daily. 30 tablet 3   Glucosamine-Chondroitin (COSAMIN DS PO) Take 1 tablet by mouth in the morning and at bedtime.     Saw Palmetto, Serenoa repens, (SAW PALMETTO PO) Take 1 capsule by mouth daily in the afternoon.     triamterene-hydrochlorothiazide (MAXZIDE-25) 37.5-25 MG tablet Take 1 tablet by mouth every Monday, Wednesday, and Friday.     No current facility-administered medications for this visit.    Allergies:   Patient has no known allergies.    Social History:  The patient  reports that he has quit smoking. He has never used smokeless tobacco. He reports that he  does not drink alcohol and does not use drugs.   Family History:  The patient's family history includes Heart attack in his father; Heart failure in his mother.    ROS:  Please see the history of present illness.   Otherwise, review of systems are positive for none.   All other systems are reviewed and negative.    PHYSICAL EXAM: VS:  BP 124/78   Pulse 70   Ht 5\' 10"  (1.778 m)   Wt 178 lb (80.7 kg)   SpO2 96%   BMI 25.54 kg/m  , BMI Body mass index is 25.54 kg/m.  GEN: Well nourished, well developed, in no acute distress  HEENT: normal  Neck: no JVD, carotid bruits, or masses Cardiac: RRR; he has a grade 2/6 holosystolic murmur at the apex.  Respiratory:  clear to auscultation bilaterally, normal work of breathing GI:  soft, nontender, nondistended, + BS MS: no deformity or atrophy  Skin: warm and dry, no rash Neuro:  Strength and sensation are intact Psych: euthymic mood, full affect  EKG Interpretation Date/Time:  Wednesday May 24 2023 13:33:13 EDT Ventricular Rate:  70 PR Interval:  184 QRS Duration:  100 QT Interval:  442 QTC Calculation: 477 R Axis:   3  Text Interpretation: Sinus rhythm with sinus arrhythmia with occasional Premature ventricular complexes Nonspecific T wave abnormality Prolonged QT When compared with ECG of 04-Mar-2006 08:57, Premature ventricular complexes are now Present Nonspecific T wave abnormality now evident in Lateral leads QT has lengthened PVCs are new Confirmed by [place], Matti Killingsworth [id number]) on 05/24/2023 1:40:51 PM      Recent Labs: No results found for requested labs within last 365 days.    Lipid Panel No results found for: "CHOL", "TRIG", "HDL", "CHOLHDL", "VLDL", "LDLCALC", "LDLDIRECT"    Dated 2/27//20: creatinine 1.2.   Wt Readings from Last 3 Encounters:  05/24/23 178 lb (80.7 kg)  05/26/22 186 lb 9.6 oz (84.6 kg)  05/05/22 183 lb 3.2 oz (83.1 kg)      Other studies Reviewed: Additional studies/ records that were reviewed today include:   Cardiac Catheterization Report    Benjamin Jensen    87 y.o.  male  DOB: 12/08/1935  MRN: [id number]  03/17/2014      PROCEDURE:  Left heart catheterization with selective coronary angiography, left ventriculogram.   INDICATIONS:  Abnormal treadmill the patient with multiple risk factors and angina pectoris with an abnormal exercise treadmill   The risks, benefits, and details of the procedure were explained to the patient.  The patient verbalized understanding and wanted to proceed.  Informed written consent was obtained.   PROCEDURE TECHNIQUE:  After Xylocaine anesthesia a 35F sheath was placed in the right femoral artery with a single anterior needle wall stick.   Left coronary angiography was done using a  Judkins L4 guide catheter.  Right coronary angiography was done using a Judkins R4 guide catheter.  A 30 cc ventriculogram was performed in the 30 degree RAO projection.  Tolerated the procedure well.  Sheath removed in the holding area.   CONTRAST:  Total of 55 cc.   COMPLICATIONS:  None.     HEMODYNAMICS:  Aortic pressure was 136/66; left ventricle postcontrast 136/5-11.  There was no gradient between the left ventricle and aorta.     ANGIOGRAPHIC DATA:     CORONARY ARTERIES:   Arise and distribute normally.  Right dominant. No coronary calcification is noted.   Left main coronary artery: Normal    Left  Cardiology Office Note   Date:  05/24/2023   ID:  Benjamin Jensen, DOB 29-Aug-1935, MRN [id number]  PCP:  Burton Apley, MD  Cardiologist:   Dariana Garbett [place], MD   Chief Complaint  Patient presents with   Mitral Valve Prolapse    History of Present Illness: Benjamin Jensen is a 87 y.o. male who is seen for follow up CAD. He had a prior abnormal stress test in 2015. This led to a cardiac cath showing only minimal wall irregularities and normal LV function. He has a history of HLD and HTN. He also has MVP with mild to moderate MR.   He had a coronary CTA in May 2022 showing no significant CAD.   He had Echo in June 2023 to follow up on his MVP and MR. This showed worsening of his MR to moderate to severe range. To further evaluate he underwent TEE on 05/05/22; this was a technically limited study. There was a partially flail P2 segment of the MV with likely moderate MR. We did place the patient on lasix to see if this would help but he developed a severe gout attack and stopped it. States he didn't notice any improvement in symptoms with the lasix.   On follow up today he is doing OK. Still push mows and walks his dog daily. Does note SOB with exertion. No edema. No chest pain. No fluttering. Has occasional gout still.    Past Medical History:  Diagnosis Date   Abnormal stress ECG with treadmill 03/17/2014   Angina pectoris (HCC) 03/17/2014   Essential hypertension 03/17/2014   Hyperlipidemia 03/17/2014   Kidney stones    Lumbar disc disease     Past Surgical History:  Procedure Laterality Date   CARPAL TUNNEL RELEASE     CATARACT EXTRACTION     KIDNEY STONE SURGERY     LEFT HEART CATHETERIZATION WITH CORONARY ANGIOGRAM N/A 03/17/2014   Procedure: LEFT HEART CATHETERIZATION WITH CORONARY ANGIOGRAM;  Surgeon: Othella Boyer, MD;  Location: [facility] CATH LAB;  Service: Cardiovascular;  Laterality: N/A;   LUMBAR LAMINECTOMY     TEE WITHOUT CARDIOVERSION N/A 05/05/2022   Procedure:  TRANSESOPHAGEAL ECHOCARDIOGRAM (TEE);  Surgeon: Jodelle Red, MD;  Location: [facility] ENDOSCOPY;  Service: Cardiovascular;  Laterality: N/A;   TONSILLECTOMY       Current Outpatient Medications  Medication Sig Dispense Refill   alfuzosin (UROXATRAL) 10 MG 24 hr tablet Take 10 mg by mouth daily in the afternoon.     amLODipine (NORVASC) 5 MG tablet Take 5 mg by mouth in the morning.     aspirin EC 81 MG tablet Take 81 mg by mouth in the morning.     atorvastatin (LIPITOR) 10 MG tablet Take 10 mg by mouth in the morning.     colchicine 0.6 MG tablet Take 1 tablet (0.6 mg total) by mouth daily. 30 tablet 3   Glucosamine-Chondroitin (COSAMIN DS PO) Take 1 tablet by mouth in the morning and at bedtime.     Saw Palmetto, Serenoa repens, (SAW PALMETTO PO) Take 1 capsule by mouth daily in the afternoon.     triamterene-hydrochlorothiazide (MAXZIDE-25) 37.5-25 MG tablet Take 1 tablet by mouth every Monday, Wednesday, and Friday.     No current facility-administered medications for this visit.    Allergies:   Patient has no known allergies.    Social History:  The patient  reports that he has quit smoking. He has never used smokeless tobacco. He reports that he

## 2023-05-24 ENCOUNTER — Ambulatory Visit: Payer: Medicare Other | Attending: Cardiology | Admitting: Cardiology

## 2023-05-24 ENCOUNTER — Encounter: Payer: Self-pay | Admitting: Cardiology

## 2023-05-24 VITALS — BP 124/78 | HR 70 | Ht 70.0 in | Wt 178.0 lb

## 2023-05-24 DIAGNOSIS — I340 Nonrheumatic mitral (valve) insufficiency: Secondary | ICD-10-CM | POA: Diagnosis not present

## 2023-05-24 DIAGNOSIS — I10 Essential (primary) hypertension: Secondary | ICD-10-CM | POA: Diagnosis not present

## 2023-05-24 DIAGNOSIS — I341 Nonrheumatic mitral (valve) prolapse: Secondary | ICD-10-CM

## 2023-05-24 NOTE — Patient Instructions (Signed)
Medication Instructions:  None *If you need a refill on your cardiac medications before your next appointment, please call your pharmacy*   Lab Work: NONE If you have labs (blood work) drawn today and your tests are completely normal, you will receive your results only by: MyChart Message (if you have MyChart) OR A paper copy in the mail If you have any lab test that is abnormal or we need to change your treatment, we will call you to review the results.   Testing/Procedures: NONE   Follow-Up: At [facility], you and your health needs are our priority.  As part of our continuing mission to provide you with exceptional heart care, we have created designated Provider Care Teams.  These Care Teams include your primary Cardiologist (physician) and Advanced Practice Providers (APPs -  Physician Assistants and Nurse Practitioners) who all work together to provide you with the care you need, when you need it.  We recommend signing up for the patient portal called "MyChart".  Sign up information is provided on this After Visit Summary.  MyChart is used to connect with patients for Virtual Visits (Telemedicine).  Patients are able to view lab/test results, encounter notes, upcoming appointments, etc.  Non-urgent messages can be sent to your provider as well.   To learn more about what you can do with MyChart, go to [email or web address].    Your next appointment:   1 year(s)  Provider:   Dr.Jordan

## 2024-03-10 ENCOUNTER — Encounter (HOSPITAL_BASED_OUTPATIENT_CLINIC_OR_DEPARTMENT_OTHER): Payer: Self-pay

## 2024-03-10 ENCOUNTER — Ambulatory Visit (HOSPITAL_BASED_OUTPATIENT_CLINIC_OR_DEPARTMENT_OTHER)
Admission: EM | Admit: 2024-03-10 | Discharge: 2024-03-10 | Disposition: A | Discharge status: Home / Self Care | Attending: Family Medicine | Admitting: Family Medicine

## 2024-03-10 DIAGNOSIS — R319 Hematuria, unspecified: Secondary | ICD-10-CM | POA: Diagnosis present

## 2024-03-10 DIAGNOSIS — R310 Gross hematuria: Secondary | ICD-10-CM | POA: Insufficient documentation

## 2024-03-10 MED ORDER — SULFAMETHOXAZOLE-TRIMETHOPRIM 800-160 MG PO TABS: 1.0000 | ORAL_TABLET | Freq: Two times a day (BID) | ORAL | 0 refills | Status: DC

## 2024-03-10 MED ORDER — SULFAMETHOXAZOLE-TRIMETHOPRIM 800-160 MG PO TABS: 1.0000 | ORAL_TABLET | Freq: Two times a day (BID) | ORAL | 0 refills | Status: AC

## 2024-03-10 NOTE — ED Provider Notes (Signed)
 [facility]    CSN: [id number] Arrival date & time: 03/10/24  0956      History   Chief Complaint Chief Complaint  Patient presents with   Hematuria    HPI Benjamin Jensen is a 88 y.o. male.    88 year old male reports gross hematuria since Friday. First void  on Friday morning was nothing but blood clots. Reports painless but clots are causing him to not be able to urinate but then will void without control causing a mess. States enlarged prostrate. No hx of cancer of the prostate or bladder. Denies kidney disease. States had this a few years ago and it cleared up in 3-4 days.  Denies any fever, chills.   Hematuria    Past Medical History:  Diagnosis Date   Abnormal stress ECG with treadmill 03/17/2014   Angina pectoris (HCC) 03/17/2014   Essential hypertension 03/17/2014   Hyperlipidemia 03/17/2014   Kidney stones    Lumbar disc disease     Patient Active Problem List   Diagnosis Date Noted   Nonrheumatic mitral valve regurgitation    Hyperlipidemia 03/17/2014   Essential hypertension 03/17/2014   Angina pectoris (HCC) 03/17/2014   Abnormal stress ECG with treadmill 03/17/2014    Past Surgical History:  Procedure Laterality Date   CARPAL TUNNEL RELEASE     CATARACT EXTRACTION     KIDNEY STONE SURGERY     LEFT HEART CATHETERIZATION WITH CORONARY ANGIOGRAM N/A 03/17/2014   Procedure: LEFT HEART CATHETERIZATION WITH CORONARY ANGIOGRAM;  Surgeon: Elsie GORMAN Somerset, MD;  Location: [facility] CATH LAB;  Service: Cardiovascular;  Laterality: N/A;   LUMBAR LAMINECTOMY     TEE WITHOUT CARDIOVERSION N/A 05/05/2022   Procedure: TRANSESOPHAGEAL ECHOCARDIOGRAM (TEE);  Surgeon: Lonni Slain, MD;  Location: [facility] ENDOSCOPY;  Service: Cardiovascular;  Laterality: N/A;   TONSILLECTOMY         Home Medications    Prior to Admission medications   Medication Sig Start Date End Date Taking? Authorizing Provider  alfuzosin (UROXATRAL) 10 MG 24 hr tablet Take 10 mg by  mouth daily in the afternoon. 10/04/18   [provider]  amLODipine (NORVASC) 5 MG tablet Take 5 mg by mouth in the morning.    [provider]  aspirin  EC 81 MG tablet Take 81 mg by mouth in the morning.    [provider]  atorvastatin (LIPITOR) 10 MG tablet Take 10 mg by mouth in the morning. 10/16/18   [provider]  colchicine  0.6 MG tablet Take 1 tablet (0.6 mg total) by mouth daily. 05/17/22   [place], Peter M, MD  Glucosamine-Chondroitin (COSAMIN DS PO) Take 1 tablet by mouth in the morning and at bedtime.    [provider]  Saw Palmetto, Serenoa repens, (SAW PALMETTO PO) Take 1 capsule by mouth daily in the afternoon.    [provider]  sulfamethoxazole -trimethoprim  (BACTRIM  DS) 800-160 MG tablet Take 1 tablet by mouth 2 (two) times daily for 7 days. 03/10/24 03/17/24  Adah Wilbert LABOR, FNP  triamterene-hydrochlorothiazide (MAXZIDE-25) 37.5-25 MG tablet Take 1 tablet by mouth every Monday, Wednesday, and Friday. 08/21/18   [provider]    Family History Family History  Problem Relation Age of Onset   Heart attack Father    Heart failure Mother     Social History Social History   Tobacco Use   Smoking status: Former   Smokeless tobacco: Never  Substance Use Topics   Alcohol use: No   Drug use: No  Allergies   Patient has no known allergies.   Review of Systems Review of Systems  Genitourinary:  Positive for hematuria.   See HPI  Physical Exam Triage Vital Signs ED Triage Vitals [03/10/24 1037]  Encounter Vitals Group     BP (!) 174/67     Girls Systolic BP Percentile      Girls Diastolic BP Percentile      Boys Systolic BP Percentile      Boys Diastolic BP Percentile      Pulse Rate 96     Resp 20     Temp 97.8 F (36.6 C)     Temp Source Oral     SpO2 96 %     Weight      Height      Head Circumference      Peak Flow      Pain Score      Pain Loc      Pain Education      Exclude from  Growth Chart    No data found.  Updated Vital Signs BP (!) 174/67 (BP Location: Right Arm)   Pulse 96   Temp 97.8 F (36.6 C) (Oral)   Resp 20   SpO2 96%   Visual Acuity Right Eye Distance:   Left Eye Distance:   Bilateral Distance:    Right Eye Near:   Left Eye Near:    Bilateral Near:     Physical Exam Constitutional:      Appearance: Normal appearance.  Pulmonary:     Effort: Pulmonary effort is normal.  Musculoskeletal:        General: Normal range of motion.  Neurological:     Mental Status: He is alert.  Psychiatric:        Mood and Affect: Mood normal.      UC Treatments / Results  Labs (all labs ordered are listed, but only abnormal results are displayed) Labs Reviewed  URINE CULTURE    EKG   Radiology No results found.  Procedures Procedures (including critical care time)  Medications Ordered in UC Medications - No data to display  Initial Impression / Assessment and Plan / UC Course  I have reviewed the triage vital signs and the nursing notes.  Pertinent labs & imaging results that were available during my care of the patient were reviewed by me and considered in my medical decision making (see chart for details).     Gross hematuria-urine with gross hematuria and clots.  Urine did show leukocytes, nitrites.  Not sure if this is skewed by the significant amount of blood.  Will go ahead and treat for urinary tract infection at this time with antibiotics.  We will send the urinalysis for culture and follow-up as needed  Recommend increase fluid intake and follow-up with PCP this week. If the problem worsens and it becomes difficult or he is unable to pee he will need to go to the ER.  Patient understanding and agreed Final Clinical Impressions(s) / UC Diagnoses   Final diagnoses:  Gross hematuria     Discharge Instructions      I am treating you for urinary tract infection that could be the cause of this problem Take the antibiotics  as prescribed.  We will call you if we need to change anything once we get your culture results. Make sure you are drinking plenty of fluids Follow-up with your doctor for any continued issues    ED Prescriptions  Medication Sig Dispense Auth. Provider   sulfamethoxazole -trimethoprim  (BACTRIM  DS) 800-160 MG tablet  (Status: Discontinued) Take 1 tablet by mouth 2 (two) times daily for 7 days. 14 tablet Mechele Kittleson A, FNP   sulfamethoxazole -trimethoprim  (BACTRIM  DS) 800-160 MG tablet Take 1 tablet by mouth 2 (two) times daily for 7 days. 14 tablet Adah Wilbert LABOR, FNP      PDMP not reviewed this encounter.   Adah Wilbert LABOR, FNP 03/10/24 1301

## 2024-03-10 NOTE — ED Triage Notes (Addendum)
 Reports gross hematuria since Friday. First void  on Friday morning was nothing but blood clots. Reports painless but clots are causing him to not be able to urinate but then will void without control causing a mess. States enlarged prostrate. No hx of cancer of the prostate or bladder. Denies kidney disease. States had this a few years ago and it cleared up in 3-4 days.

## 2024-03-10 NOTE — Discharge Instructions (Signed)
 I am treating you for urinary tract infection that could be the cause of this problem Take the antibiotics as prescribed.  We will call you if we need to change anything once we get your culture results. Make sure you are drinking plenty of fluids Follow-up with your doctor for any continued issues

## 2024-03-10 NOTE — ED Notes (Signed)
 Urinalysis attempted. Gross hematuria may cause incorrect results. Discussed with provider. Urine culture will be performed.

## 2024-03-11 LAB — URINE CULTURE: Culture: NO GROWTH

## 2024-03-13 ENCOUNTER — Ambulatory Visit (HOSPITAL_COMMUNITY): Payer: Self-pay
